# Patient Record
Sex: Male | Born: 1940 | Race: White | Hispanic: No | Marital: Married | Smoking: Former smoker
Health system: Southern US, Community
[De-identification: ages and names within clinical notes are randomized; demographics above are authoritative.]

## PROBLEM LIST (undated history)

## (undated) DIAGNOSIS — I739 Peripheral vascular disease, unspecified: Secondary | ICD-10-CM

## (undated) DIAGNOSIS — IMO0002 Reserved for concepts with insufficient information to code with codable children: Secondary | ICD-10-CM

## (undated) DIAGNOSIS — I779 Disorder of arteries and arterioles, unspecified: Secondary | ICD-10-CM

## (undated) DIAGNOSIS — F32A Depression, unspecified: Secondary | ICD-10-CM

## (undated) DIAGNOSIS — I10 Essential (primary) hypertension: Secondary | ICD-10-CM

## (undated) DIAGNOSIS — R9430 Abnormal result of cardiovascular function study, unspecified: Secondary | ICD-10-CM

## (undated) DIAGNOSIS — F329 Major depressive disorder, single episode, unspecified: Secondary | ICD-10-CM

## (undated) DIAGNOSIS — Z720 Tobacco use: Secondary | ICD-10-CM

## (undated) DIAGNOSIS — M1990 Unspecified osteoarthritis, unspecified site: Secondary | ICD-10-CM

## (undated) DIAGNOSIS — Z9081 Acquired absence of spleen: Secondary | ICD-10-CM

## (undated) DIAGNOSIS — C3490 Malignant neoplasm of unspecified part of unspecified bronchus or lung: Secondary | ICD-10-CM

## (undated) DIAGNOSIS — H3530 Unspecified macular degeneration: Secondary | ICD-10-CM

## (undated) DIAGNOSIS — Z951 Presence of aortocoronary bypass graft: Secondary | ICD-10-CM

## (undated) DIAGNOSIS — E785 Hyperlipidemia, unspecified: Secondary | ICD-10-CM

## (undated) DIAGNOSIS — J449 Chronic obstructive pulmonary disease, unspecified: Secondary | ICD-10-CM

## (undated) DIAGNOSIS — I4891 Unspecified atrial fibrillation: Secondary | ICD-10-CM

## (undated) DIAGNOSIS — I2510 Atherosclerotic heart disease of native coronary artery without angina pectoris: Secondary | ICD-10-CM

## (undated) DIAGNOSIS — C4490 Unspecified malignant neoplasm of skin, unspecified: Secondary | ICD-10-CM

## (undated) HISTORY — DX: Essential (primary) hypertension: I10

## (undated) HISTORY — PX: OTHER SURGICAL HISTORY: SHX169

## (undated) HISTORY — DX: Acquired absence of spleen: Z90.81

## (undated) HISTORY — DX: Major depressive disorder, single episode, unspecified: F32.9

## (undated) HISTORY — DX: Unspecified macular degeneration: H35.30

## (undated) HISTORY — DX: Disorder of arteries and arterioles, unspecified: I77.9

## (undated) HISTORY — DX: Depression, unspecified: F32.A

## (undated) HISTORY — DX: Malignant neoplasm of unspecified part of unspecified bronchus or lung: C34.90

## (undated) HISTORY — DX: Abnormal result of cardiovascular function study, unspecified: R94.30

## (undated) HISTORY — PX: THORACOTOMY: SUR1349

## (undated) HISTORY — DX: Tobacco use: Z72.0

## (undated) HISTORY — DX: Presence of aortocoronary bypass graft: Z95.1

## (undated) HISTORY — DX: Chronic obstructive pulmonary disease, unspecified: J44.9

## (undated) HISTORY — DX: Hyperlipidemia, unspecified: E78.5

## (undated) HISTORY — DX: Atherosclerotic heart disease of native coronary artery without angina pectoris: I25.10

## (undated) HISTORY — DX: Peripheral vascular disease, unspecified: I73.9

## (undated) HISTORY — DX: Unspecified malignant neoplasm of skin, unspecified: C44.90

## (undated) HISTORY — DX: Reserved for concepts with insufficient information to code with codable children: IMO0002

## (undated) HISTORY — DX: Unspecified osteoarthritis, unspecified site: M19.90

---

## 1962-04-14 HISTORY — PX: OTHER SURGICAL HISTORY: SHX169

## 1968-04-14 HISTORY — PX: APPENDECTOMY: SHX54

## 2002-12-22 ENCOUNTER — Encounter: Admission: RE | Admit: 2002-12-22 | Discharge: 2002-12-22 | Payer: Self-pay | Admitting: Endocrinology

## 2002-12-22 ENCOUNTER — Encounter: Payer: Self-pay | Admitting: Endocrinology

## 2005-04-22 ENCOUNTER — Ambulatory Visit: Payer: Self-pay | Admitting: Endocrinology

## 2005-04-22 ENCOUNTER — Ambulatory Visit (HOSPITAL_COMMUNITY): Admission: RE | Admit: 2005-04-22 | Discharge: 2005-04-22 | Payer: Self-pay | Admitting: Endocrinology

## 2005-04-28 ENCOUNTER — Ambulatory Visit: Payer: Self-pay | Admitting: Endocrinology

## 2005-04-28 ENCOUNTER — Ambulatory Visit (HOSPITAL_COMMUNITY): Admission: RE | Admit: 2005-04-28 | Discharge: 2005-04-28 | Payer: Self-pay | Admitting: Endocrinology

## 2005-05-02 ENCOUNTER — Ambulatory Visit: Payer: Self-pay | Admitting: Endocrinology

## 2005-05-13 ENCOUNTER — Ambulatory Visit: Payer: Self-pay | Admitting: Physical Medicine and Rehabilitation

## 2005-05-13 ENCOUNTER — Encounter
Admission: RE | Admit: 2005-05-13 | Discharge: 2005-08-11 | Payer: Self-pay | Admitting: Physical Medicine and Rehabilitation

## 2005-05-20 ENCOUNTER — Encounter
Admission: RE | Admit: 2005-05-20 | Discharge: 2005-08-18 | Payer: Self-pay | Admitting: Physical Medicine and Rehabilitation

## 2005-07-24 ENCOUNTER — Ambulatory Visit: Payer: Self-pay | Admitting: Physical Medicine and Rehabilitation

## 2005-08-21 ENCOUNTER — Encounter
Admission: RE | Admit: 2005-08-21 | Discharge: 2005-11-19 | Payer: Self-pay | Admitting: Physical Medicine and Rehabilitation

## 2005-08-27 ENCOUNTER — Ambulatory Visit: Payer: Self-pay | Admitting: Physical Medicine & Rehabilitation

## 2005-09-18 ENCOUNTER — Ambulatory Visit: Payer: Self-pay | Admitting: Physical Medicine and Rehabilitation

## 2006-01-06 ENCOUNTER — Ambulatory Visit: Payer: Self-pay | Admitting: Physical Medicine and Rehabilitation

## 2006-01-06 ENCOUNTER — Encounter
Admission: RE | Admit: 2006-01-06 | Discharge: 2006-04-06 | Payer: Self-pay | Admitting: Physical Medicine and Rehabilitation

## 2006-01-21 ENCOUNTER — Ambulatory Visit: Payer: Self-pay | Admitting: Physical Medicine & Rehabilitation

## 2006-01-21 ENCOUNTER — Encounter
Admission: RE | Admit: 2006-01-21 | Discharge: 2006-04-21 | Payer: Self-pay | Admitting: Physical Medicine & Rehabilitation

## 2006-04-28 ENCOUNTER — Encounter
Admission: RE | Admit: 2006-04-28 | Discharge: 2006-07-27 | Payer: Self-pay | Admitting: Physical Medicine and Rehabilitation

## 2006-04-28 ENCOUNTER — Ambulatory Visit: Payer: Self-pay | Admitting: Physical Medicine and Rehabilitation

## 2006-04-29 ENCOUNTER — Ambulatory Visit (HOSPITAL_COMMUNITY)
Admission: RE | Admit: 2006-04-29 | Discharge: 2006-04-29 | Payer: Self-pay | Admitting: Physical Medicine and Rehabilitation

## 2006-05-18 ENCOUNTER — Encounter
Admission: RE | Admit: 2006-05-18 | Discharge: 2006-06-24 | Payer: Self-pay | Admitting: Physical Medicine & Rehabilitation

## 2006-05-19 ENCOUNTER — Ambulatory Visit: Payer: Self-pay | Admitting: Physical Medicine & Rehabilitation

## 2006-06-23 ENCOUNTER — Ambulatory Visit: Payer: Self-pay | Admitting: Physical Medicine and Rehabilitation

## 2006-07-01 ENCOUNTER — Ambulatory Visit (HOSPITAL_COMMUNITY)
Admission: RE | Admit: 2006-07-01 | Discharge: 2006-07-01 | Payer: Self-pay | Admitting: Physical Medicine and Rehabilitation

## 2006-07-20 ENCOUNTER — Ambulatory Visit: Payer: Self-pay | Admitting: Physical Medicine and Rehabilitation

## 2006-07-20 ENCOUNTER — Encounter
Admission: RE | Admit: 2006-07-20 | Discharge: 2006-10-18 | Payer: Self-pay | Admitting: Physical Medicine and Rehabilitation

## 2006-08-08 ENCOUNTER — Ambulatory Visit: Payer: Self-pay | Admitting: Pulmonary Disease

## 2006-08-08 ENCOUNTER — Inpatient Hospital Stay (HOSPITAL_COMMUNITY): Admission: EM | Admit: 2006-08-08 | Discharge: 2006-08-17 | Payer: Self-pay | Admitting: Emergency Medicine

## 2006-08-08 ENCOUNTER — Ambulatory Visit: Payer: Self-pay | Admitting: Cardiology

## 2006-08-10 ENCOUNTER — Encounter: Payer: Self-pay | Admitting: Vascular Surgery

## 2006-08-10 HISTORY — PX: CORONARY ANGIOPLASTY: SHX604

## 2006-08-11 ENCOUNTER — Ambulatory Visit: Payer: Self-pay | Admitting: Surgery

## 2006-08-12 ENCOUNTER — Encounter: Payer: Self-pay | Admitting: Surgery

## 2006-09-10 ENCOUNTER — Ambulatory Visit: Payer: Self-pay | Admitting: *Deleted

## 2006-09-10 HISTORY — PX: CORONARY ARTERY BYPASS GRAFT: SHX141

## 2006-09-15 ENCOUNTER — Ambulatory Visit: Payer: Self-pay | Admitting: Surgery

## 2006-09-30 ENCOUNTER — Inpatient Hospital Stay (HOSPITAL_COMMUNITY): Admission: RE | Admit: 2006-09-30 | Discharge: 2006-10-05 | Payer: Self-pay | Admitting: Surgery

## 2006-09-30 ENCOUNTER — Encounter: Payer: Self-pay | Admitting: Surgery

## 2006-09-30 ENCOUNTER — Ambulatory Visit: Payer: Self-pay | Admitting: Surgery

## 2006-10-20 ENCOUNTER — Encounter: Admission: RE | Admit: 2006-10-20 | Discharge: 2006-10-20 | Payer: Self-pay | Admitting: Surgery

## 2006-10-20 ENCOUNTER — Ambulatory Visit: Payer: Self-pay | Admitting: Surgery

## 2006-10-23 ENCOUNTER — Inpatient Hospital Stay (HOSPITAL_COMMUNITY): Admission: AD | Admit: 2006-10-23 | Discharge: 2006-10-29 | Payer: Self-pay | Admitting: Surgery

## 2006-11-03 ENCOUNTER — Ambulatory Visit: Payer: Self-pay | Admitting: Gastroenterology

## 2006-11-11 ENCOUNTER — Ambulatory Visit: Payer: Self-pay | Admitting: Cardiology

## 2006-12-11 ENCOUNTER — Encounter: Payer: Self-pay | Admitting: Endocrinology

## 2006-12-11 DIAGNOSIS — Z85828 Personal history of other malignant neoplasm of skin: Secondary | ICD-10-CM

## 2006-12-24 ENCOUNTER — Ambulatory Visit: Payer: Self-pay | Admitting: Cardiology

## 2006-12-24 LAB — CONVERTED CEMR LAB
ALT: 18 units/L (ref 0–53)
AST: 20 units/L (ref 0–37)
Basophils Absolute: 0 10*3/uL (ref 0.0–0.1)
Basophils Relative: 0.2 % (ref 0.0–1.0)
Bilirubin, Direct: 0.1 mg/dL (ref 0.0–0.3)
Eosinophils Relative: 1.6 % (ref 0.0–5.0)
Hemoglobin: 11.6 g/dL — ABNORMAL LOW (ref 13.0–17.0)
Lymphocytes Relative: 32.6 % (ref 12.0–46.0)
MCV: 87.5 fL (ref 78.0–100.0)
Monocytes Relative: 10.8 % (ref 3.0–11.0)
Neutrophils Relative %: 54.8 % (ref 43.0–77.0)
Platelets: 251 10*3/uL (ref 150–400)
RDW: 18.4 % — ABNORMAL HIGH (ref 11.5–14.6)

## 2007-01-01 ENCOUNTER — Ambulatory Visit: Payer: Self-pay | Admitting: Cardiology

## 2007-01-01 LAB — CONVERTED CEMR LAB
HDL: 78 mg/dL (ref >39.0)
LDL Cholesterol: 53 mg/dL (ref 0–99)
VLDL: 14 mg/dL (ref 0–40)

## 2007-01-19 ENCOUNTER — Ambulatory Visit: Payer: Self-pay | Admitting: Surgery

## 2007-01-19 ENCOUNTER — Encounter: Admission: RE | Admit: 2007-01-19 | Discharge: 2007-01-19 | Payer: Self-pay | Admitting: Surgery

## 2007-01-20 ENCOUNTER — Ambulatory Visit: Payer: Self-pay | Admitting: Endocrinology

## 2007-04-20 ENCOUNTER — Ambulatory Visit: Payer: Self-pay | Admitting: Surgery

## 2007-04-20 ENCOUNTER — Encounter: Admission: RE | Admit: 2007-04-20 | Discharge: 2007-04-20 | Payer: Self-pay | Admitting: Surgery

## 2007-04-20 ENCOUNTER — Encounter: Payer: Self-pay | Admitting: Endocrinology

## 2007-06-21 ENCOUNTER — Ambulatory Visit: Payer: Self-pay | Admitting: Cardiology

## 2007-06-21 LAB — CONVERTED CEMR LAB
AST: 19 units/L (ref 0–37)
Albumin: 3.7 g/dL (ref 3.5–5.2)
Alkaline Phosphatase: 66 units/L (ref 39–117)
Total Bilirubin: 0.7 mg/dL (ref 0.3–1.2)
Triglycerides: 72 mg/dL (ref 0–149)

## 2007-06-29 ENCOUNTER — Ambulatory Visit: Payer: Self-pay | Admitting: Cardiology

## 2007-10-19 ENCOUNTER — Encounter: Admission: RE | Admit: 2007-10-19 | Discharge: 2007-10-19 | Payer: Self-pay | Admitting: Surgery

## 2007-10-19 ENCOUNTER — Ambulatory Visit: Payer: Self-pay | Admitting: Surgery

## 2008-01-20 ENCOUNTER — Ambulatory Visit: Payer: Self-pay | Admitting: Cardiology

## 2008-01-20 LAB — CONVERTED CEMR LAB
Alkaline Phosphatase: 66 units/L (ref 39–117)
Bilirubin, Direct: 0.1 mg/dL (ref 0.0–0.3)
Cholesterol: 157 mg/dL (ref 0–200)
LDL Cholesterol: 76 mg/dL (ref 0–99)
Total CHOL/HDL Ratio: 2.8

## 2008-04-25 ENCOUNTER — Encounter: Admission: RE | Admit: 2008-04-25 | Discharge: 2008-04-25 | Payer: Self-pay | Admitting: Surgery

## 2008-04-25 ENCOUNTER — Ambulatory Visit: Payer: Self-pay | Admitting: Surgery

## 2008-07-20 ENCOUNTER — Ambulatory Visit: Payer: Self-pay | Admitting: Cardiology

## 2008-07-20 LAB — CONVERTED CEMR LAB
HDL: 58.1 mg/dL (ref >39.00)
LDL Cholesterol: 84 mg/dL (ref 0–99)
Total Bilirubin: 0.8 mg/dL (ref 0.3–1.2)
Total CHOL/HDL Ratio: 3
VLDL: 20.4 mg/dL (ref 0.0–40.0)

## 2008-07-21 DIAGNOSIS — Z8669 Personal history of other diseases of the nervous system and sense organs: Secondary | ICD-10-CM | POA: Insufficient documentation

## 2008-07-21 DIAGNOSIS — Z8739 Personal history of other diseases of the musculoskeletal system and connective tissue: Secondary | ICD-10-CM

## 2008-07-25 ENCOUNTER — Encounter: Payer: Self-pay | Admitting: Cardiology

## 2008-07-25 ENCOUNTER — Ambulatory Visit: Payer: Self-pay | Admitting: Cardiology

## 2008-09-14 IMAGING — CR DG TIBIA/FIBULA 2V*L*
3 series · 3 of 3 positions shown · non-contrast
Comparison: none

CLINICAL DATA: Pain and numbness extending to distal left tibia and fibula.
 LEFT TIBIA AND FIBULA - 3 VIEW:

[view not recorded (1 of 3)]
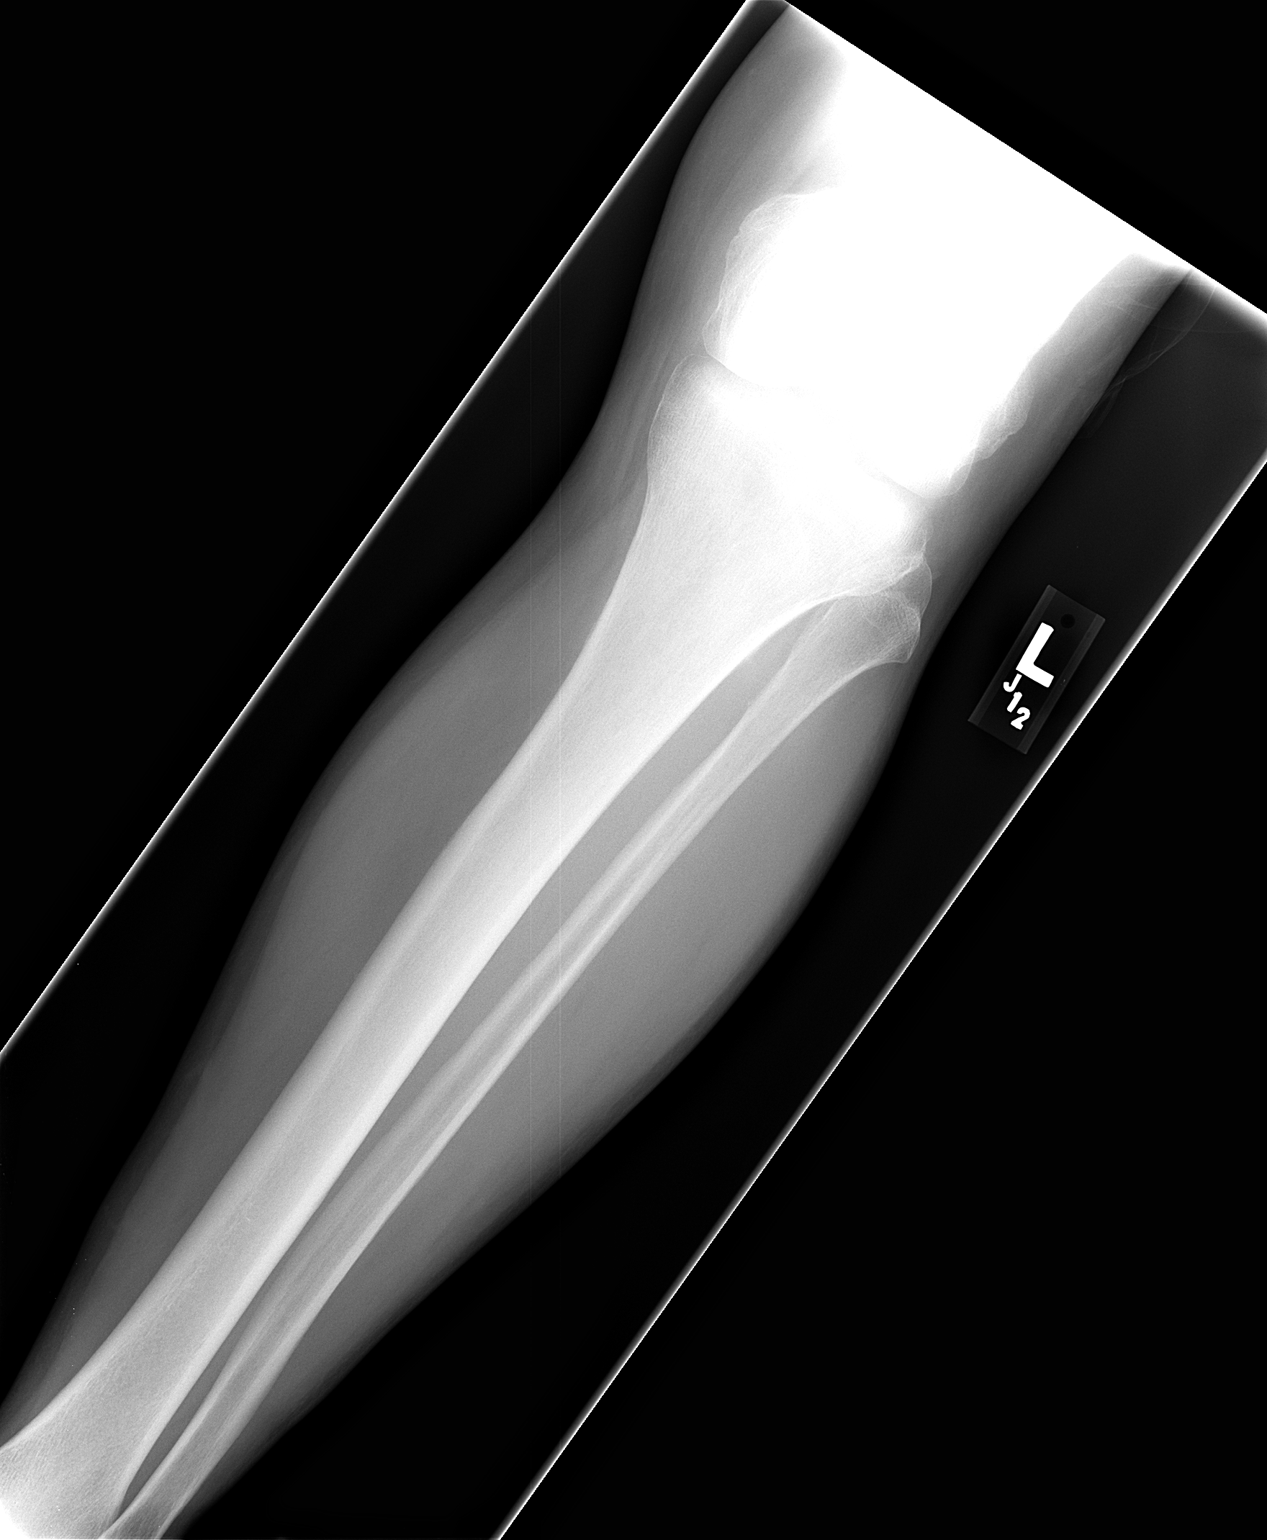

[view not recorded (2 of 3)]
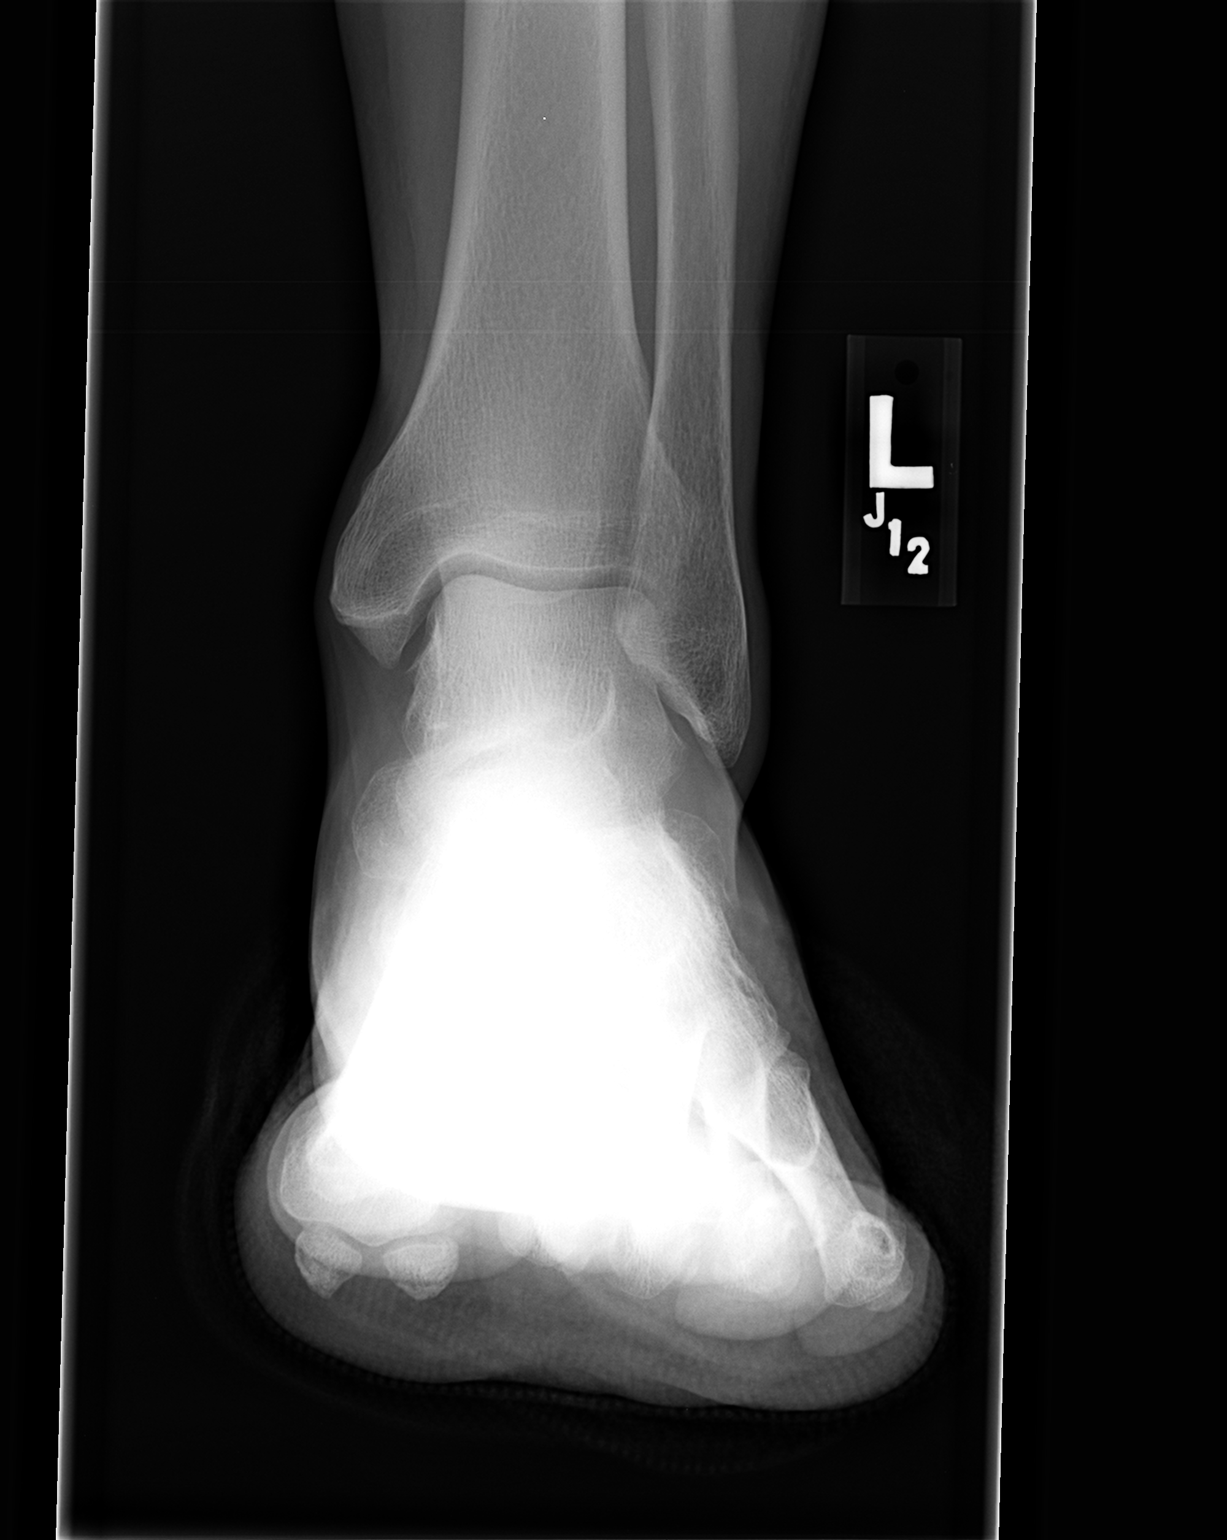

[view not recorded (3 of 3)]
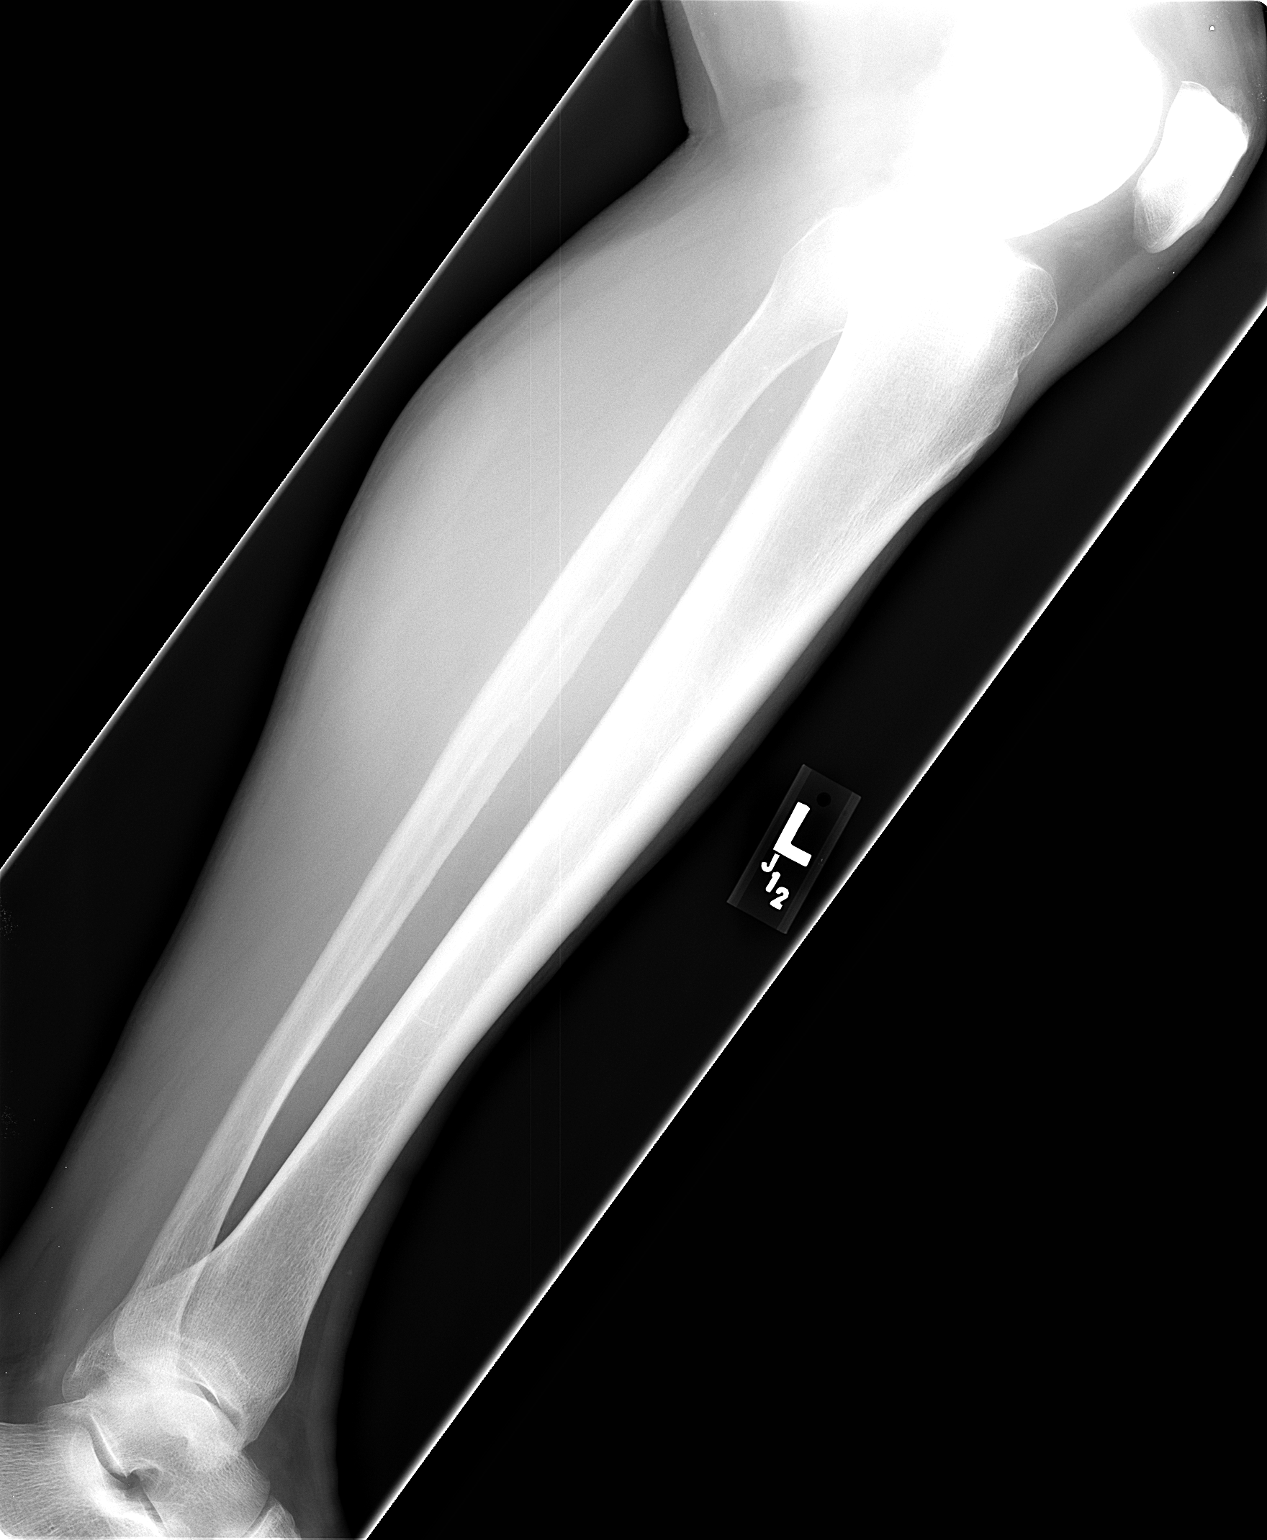

[3 of 3 positions shown; findings below may reference images not displayed]

FINDINGS: There is no evidence of fracture or other focal bone lesions.  Soft tissues are unremarkable.
IMPRESSION: Negative.

## 2008-11-07 ENCOUNTER — Encounter: Admission: RE | Admit: 2008-11-07 | Discharge: 2008-11-07 | Payer: Self-pay | Admitting: Surgery

## 2008-11-07 ENCOUNTER — Ambulatory Visit: Payer: Self-pay | Admitting: Surgery

## 2009-01-17 ENCOUNTER — Encounter: Payer: Self-pay | Admitting: Cardiology

## 2009-02-14 IMAGING — CR DG CHEST 2V
2 series · 2 of 2 positions shown · non-contrast
Comparison: 2-view chest 08/16/06 and chest CT 08/08/06.

CLINICAL DATA: Lung lesion.  Preoperative respiratory exam.  CABG.  
 CHEST ? 2 VIEW:

[view not recorded (1 of 2)]
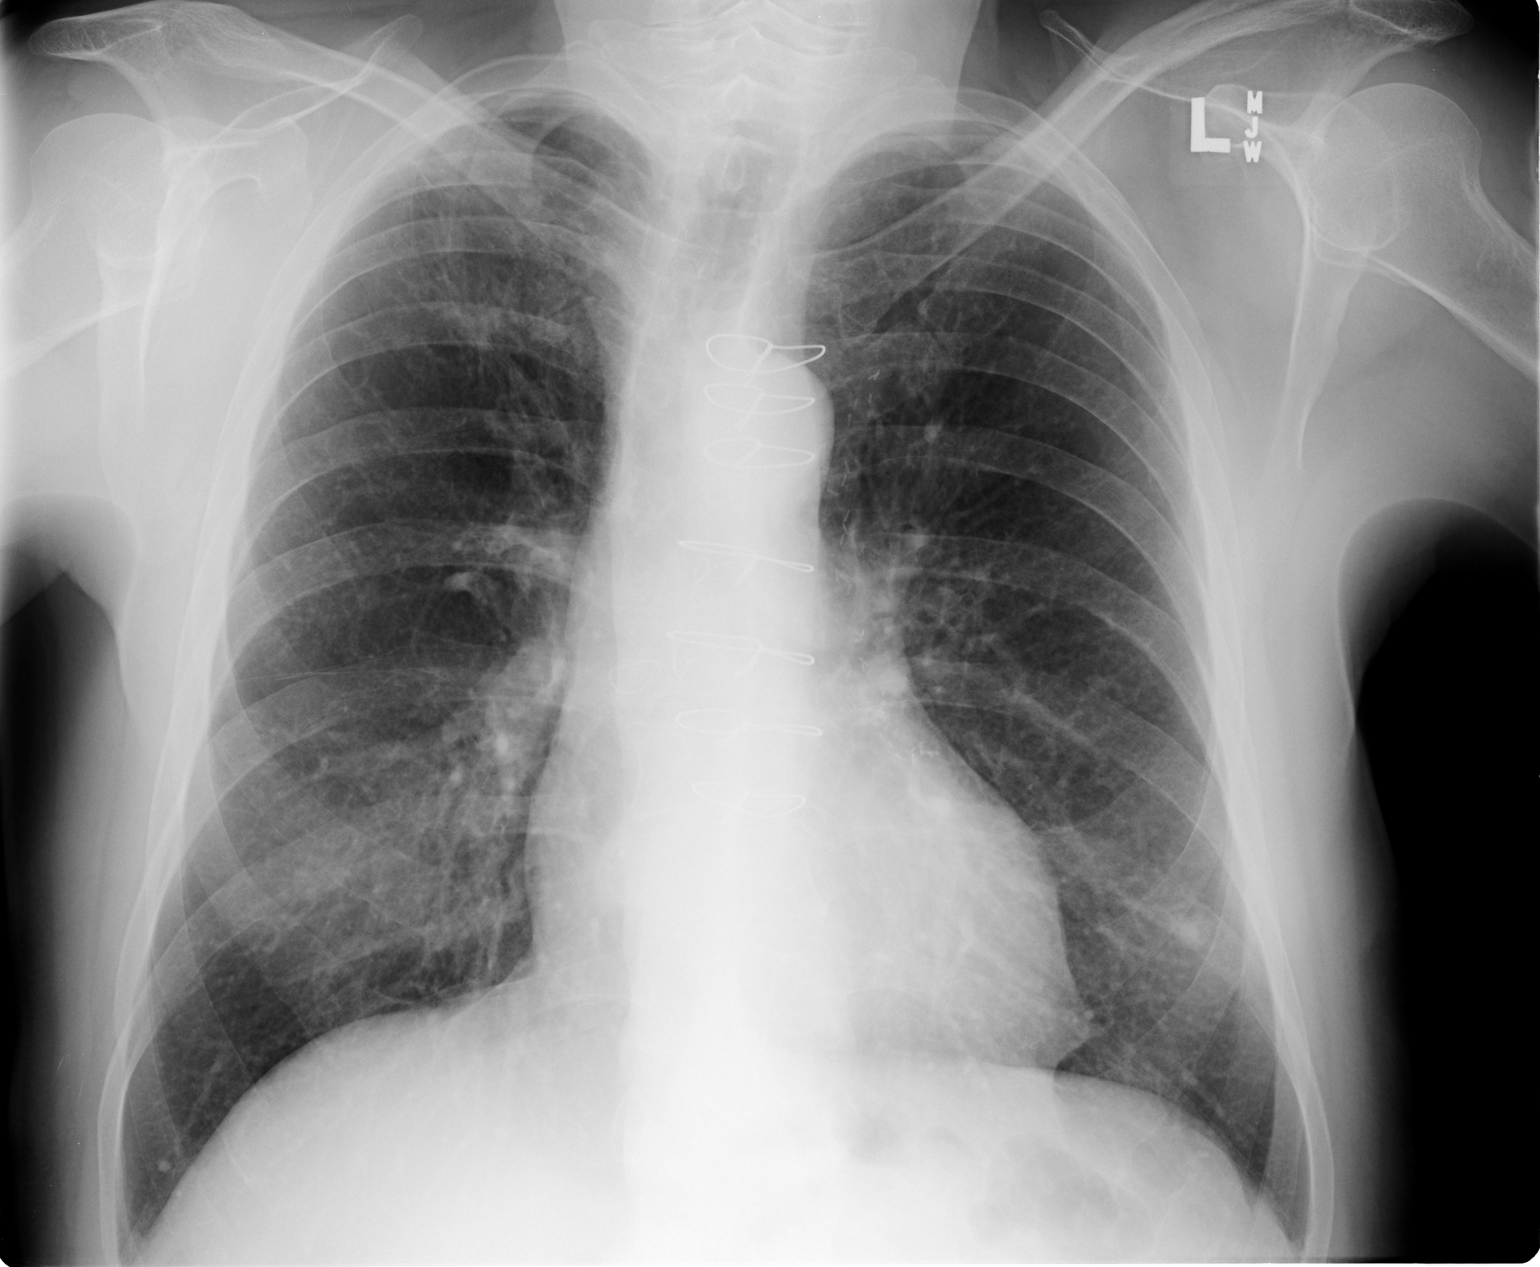

[view not recorded (2 of 2)]
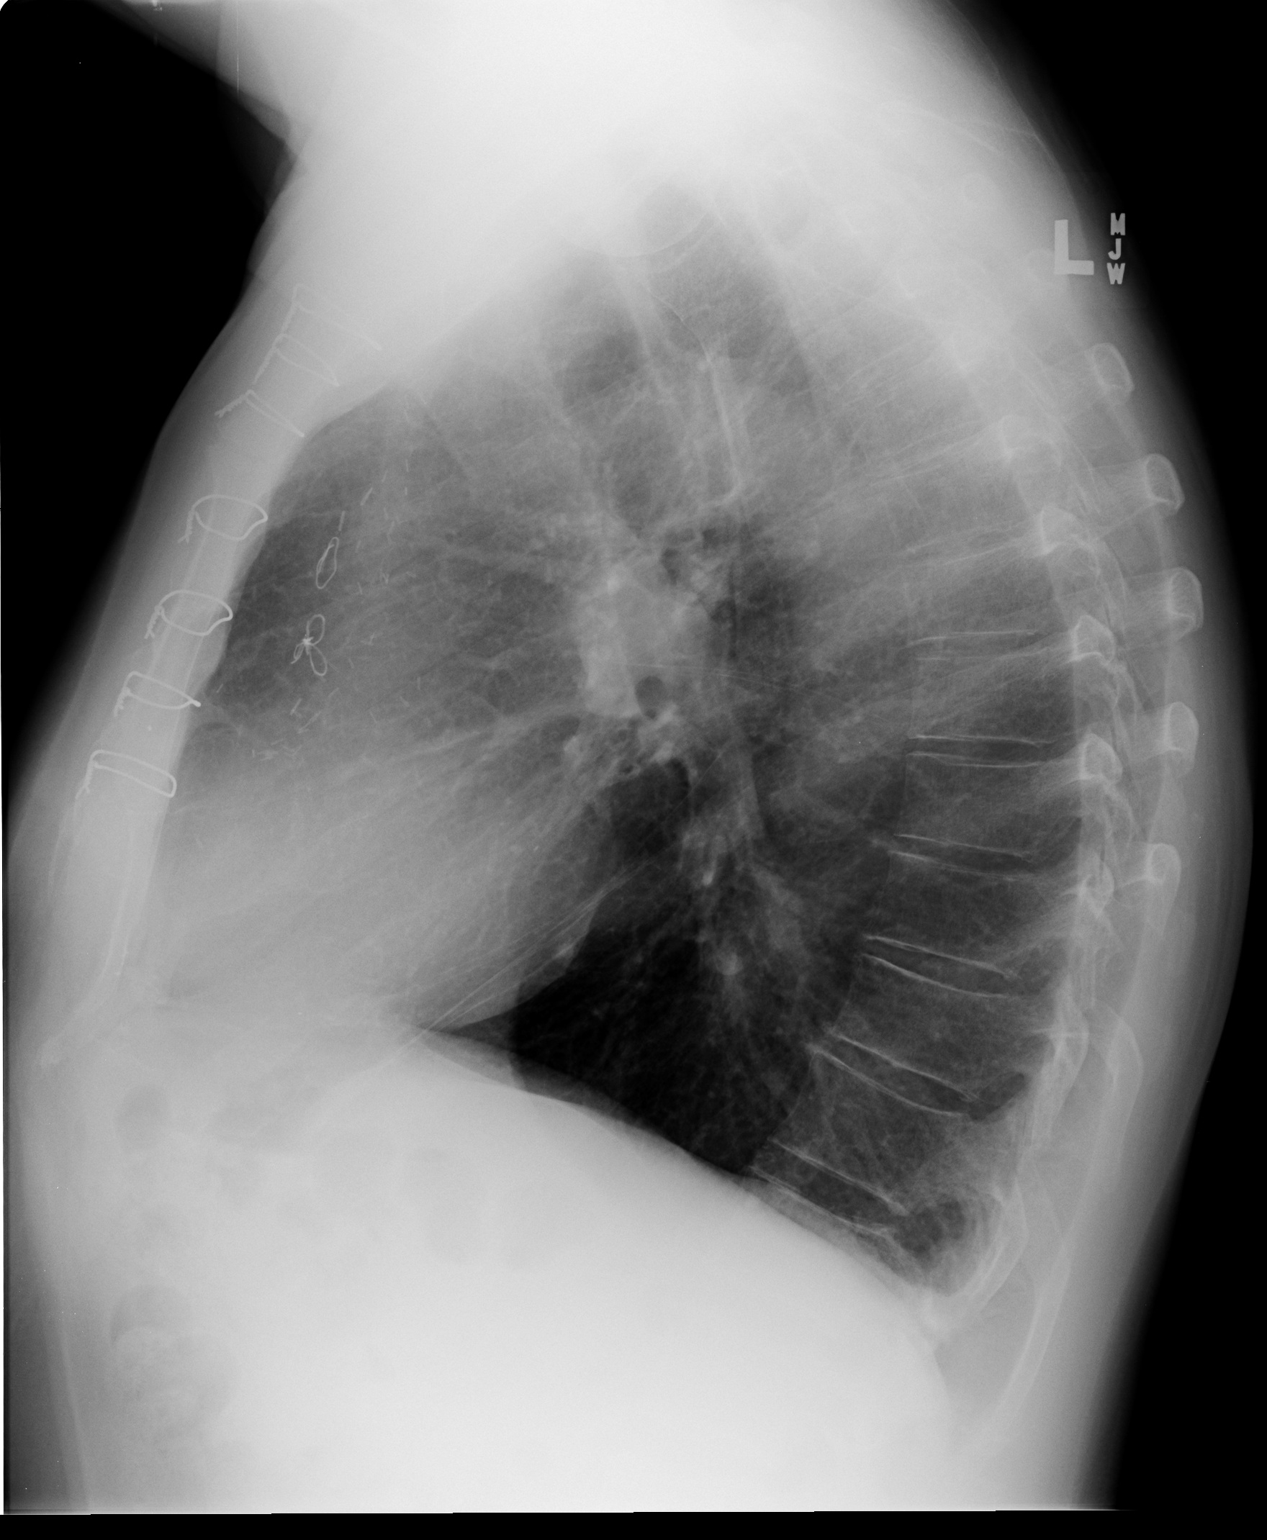

[2 of 2 positions shown; findings below may reference images not displayed]

FINDINGS: The cardiomediastinal contours are stable status post median sternotomy and CABG.  The previously demonstrated pleural effusions have resolved and the basilar aeration is improved.  There is no edema or pneumothorax.  Irregular density at the right apex appears slightly less dense than on the prior examinations.  This difference could be positional, as this density no longer overlies the anterior aspect of the right first rib.  No acute osseous findings are seen.
IMPRESSION: 1.  Interval resolution of bilateral pleural effusion with improved basilar aeration.  
 2.  The known spiculated density at the right apex may be slightly less dense.  This difference could be technical.  Correlate clinically.  Follow-up CT may be helpful if further evaluation is warranted.  
 3.  No acute chest findings.

## 2009-02-17 IMAGING — CR DG CHEST 1V PORT
1 series · 1 of 1 positions shown · non-contrast
Comparison: 10/01/06 and CT chest 08/08/06.

CLINICAL DATA: Lung lesion.
 PORTABLE CHEST - 1 VIEW:

[view not recorded]
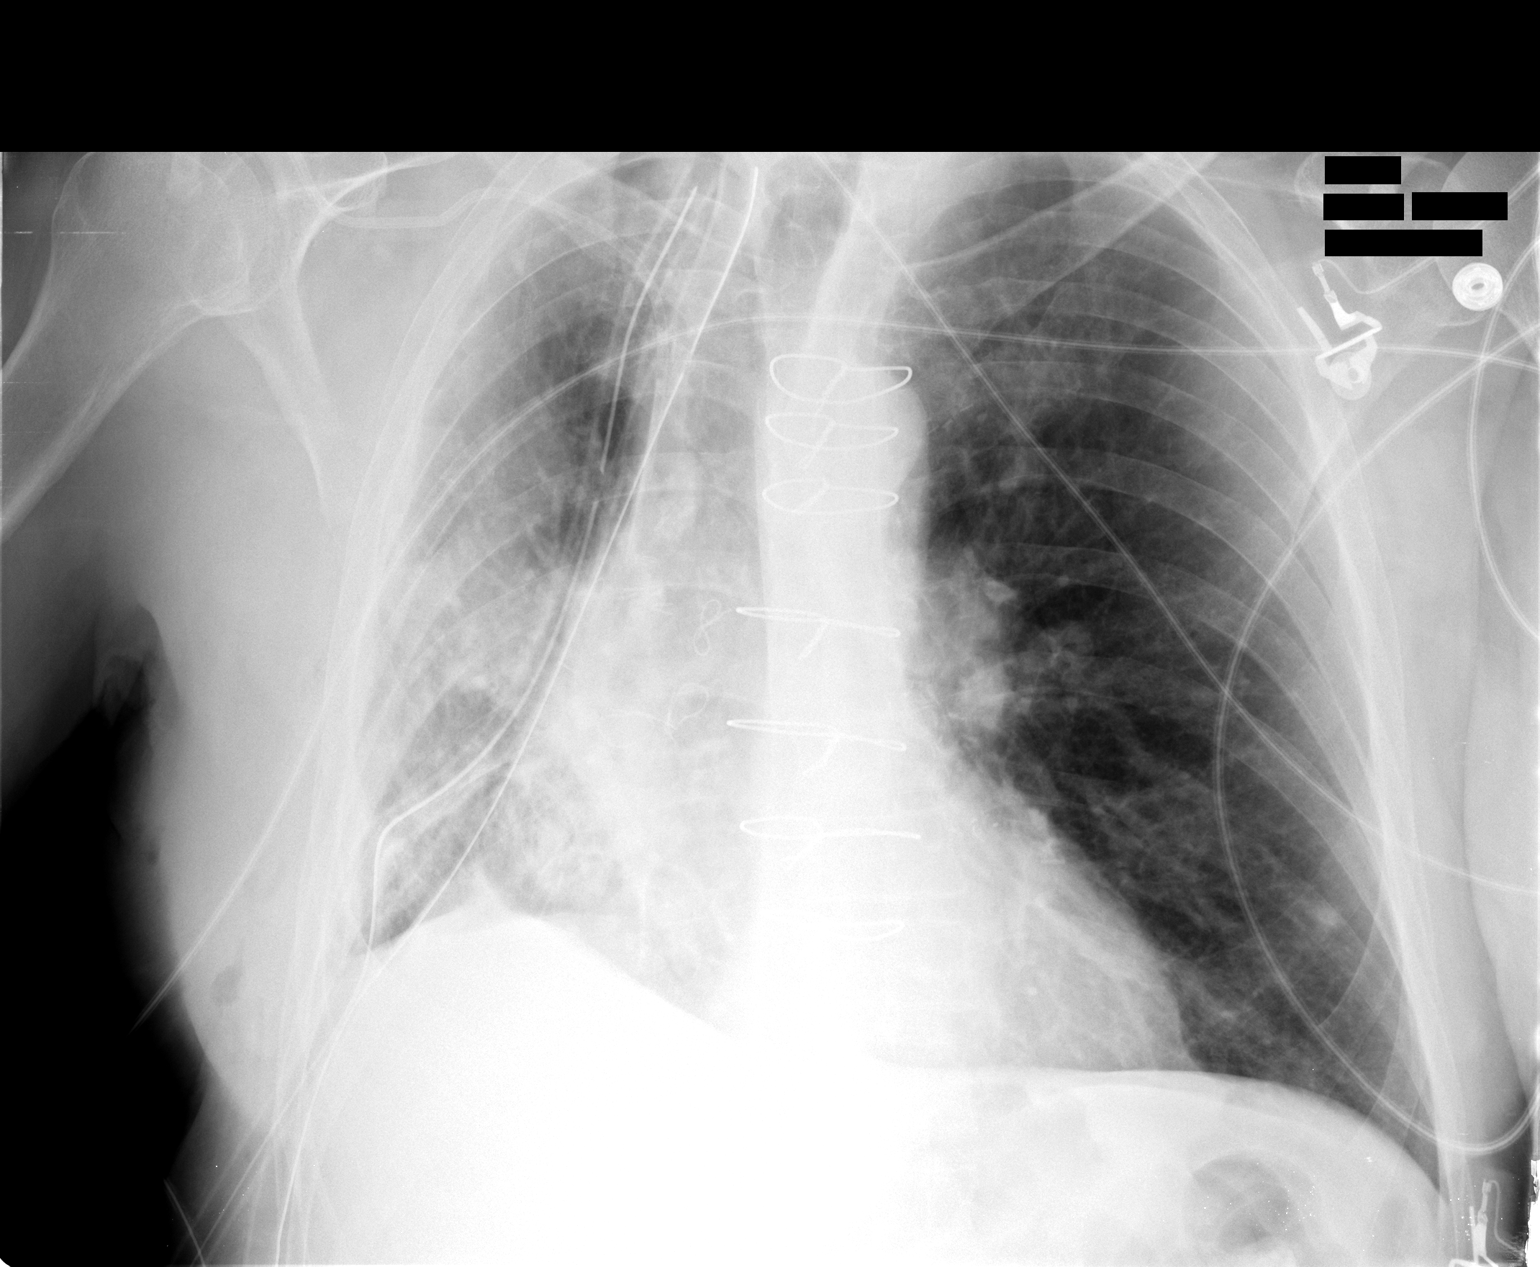

[1 of 1 positions shown; findings below may reference images not displayed]

FINDINGS: Postoperative changes and volume loss are seen in the right hemithorax where there is persistent right perihilar opacity. Two right chest tubes with a small right apical pneumothorax. Small left lower lobe nodule is again seen.  Improving left basilar aeration.
IMPRESSION: 1.  Postoperative changes in the right hemithorax with tiny right apical pneumothorax and persistent right perihilar opacity.
 2.  Improving left basilar aeration.

## 2009-02-18 IMAGING — CR DG CHEST 1V PORT
1 series · 1 of 1 positions shown · non-contrast
Comparison: Portal chest x-ray yesterday and dating back to 09/30/2006.

CLINICAL DATA: Postop right upper lobectomy for lung cancer.

PORTABLE CHEST - 1 VIEW  [DATE]/[id number] hours:

[view not recorded]
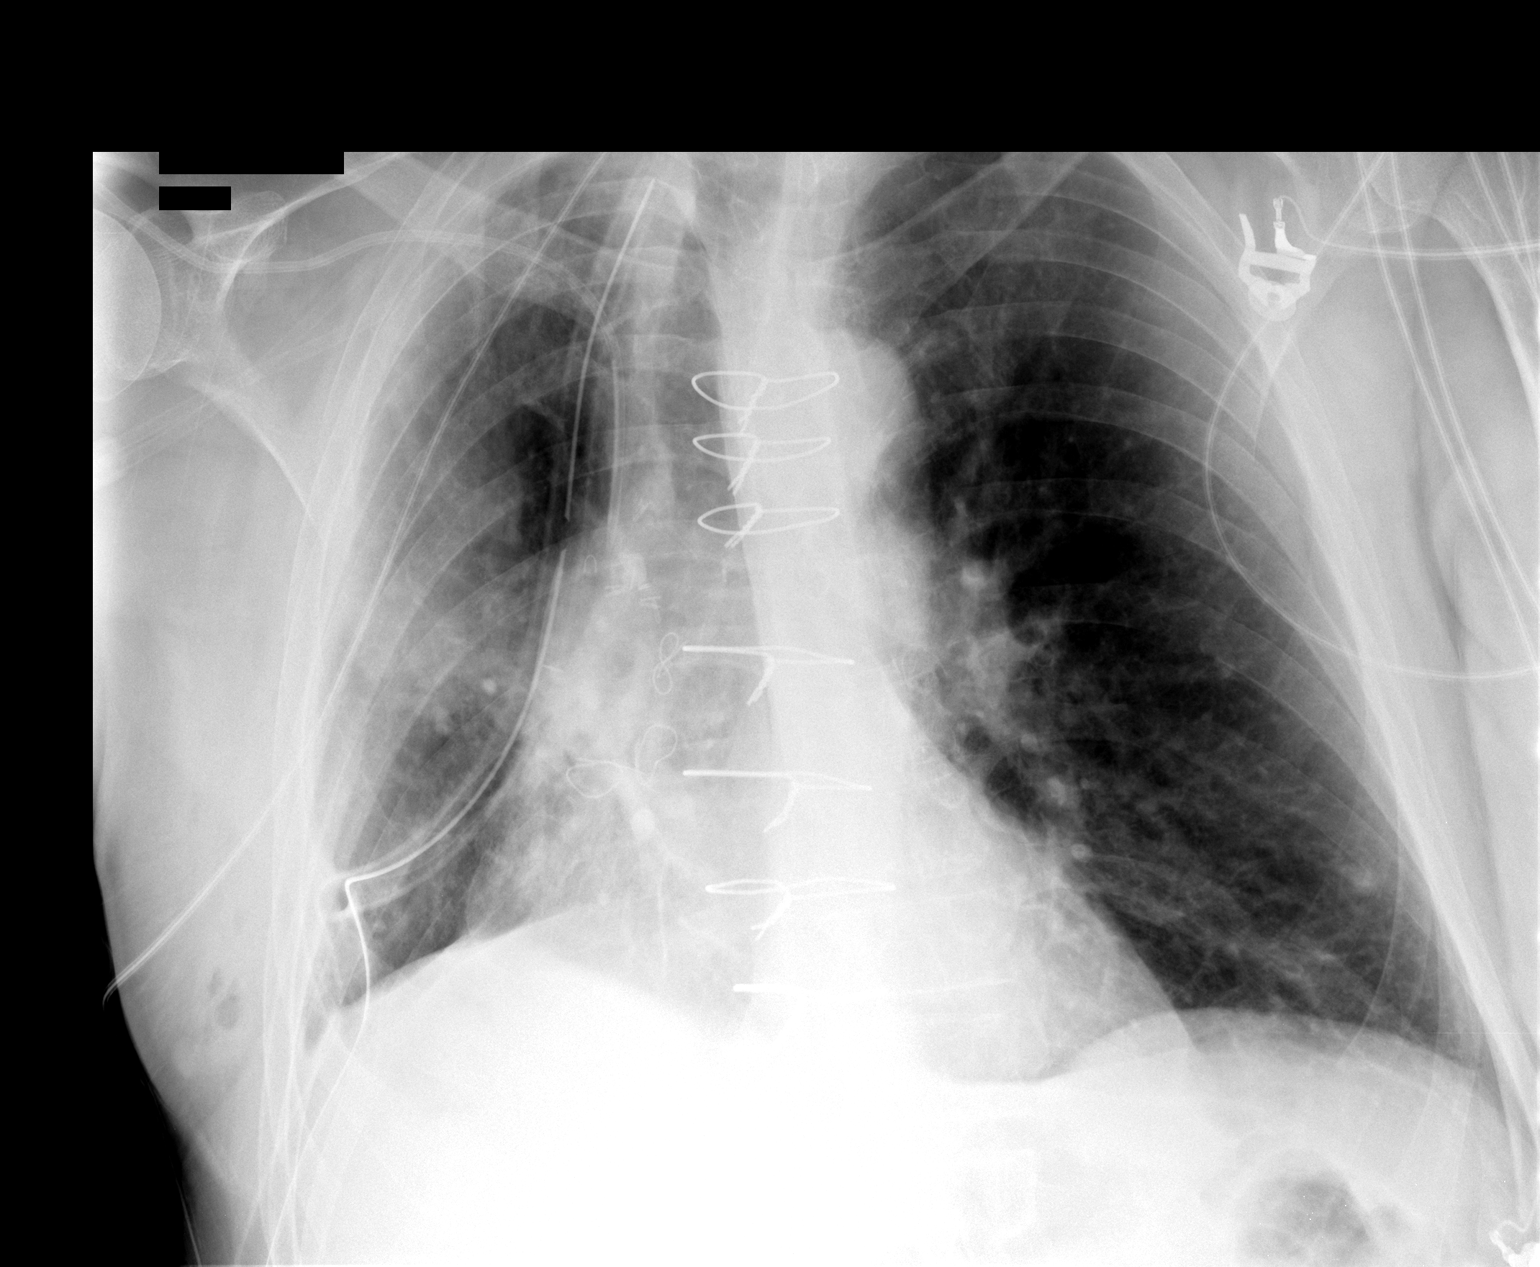

[1 of 1 positions shown; findings below may reference images not displayed]

FINDINGS: Removal of 1 of the 2 right chest tubes. Resolution of the small
right apical pneumothorax. Improved aeration throughout the remaining right
lung, with mild residual atelectasis or contusion in the right mid lung.
Calcified granuloma left lower lobe unchanged. Left lung otherwise clear. Right
subclavian central venous catheter tip remains in the SVC.
IMPRESSION: No pneumothorax after removal of 1 of the 2 right chest tubes. Improved aeration
in the remaining right lung with minimal residual post-operative
atelectasis/contusion persisting. Left lower lobe granuloma. Left lung remains
clear otherwise.

## 2009-04-17 ENCOUNTER — Encounter: Admission: RE | Admit: 2009-04-17 | Discharge: 2009-04-17 | Payer: Self-pay | Admitting: Surgery

## 2009-04-17 ENCOUNTER — Ambulatory Visit: Payer: Self-pay | Admitting: Surgery

## 2009-05-10 ENCOUNTER — Telehealth (INDEPENDENT_AMBULATORY_CARE_PROVIDER_SITE_OTHER): Payer: Self-pay | Admitting: *Deleted

## 2009-07-26 ENCOUNTER — Ambulatory Visit: Payer: Self-pay | Admitting: Cardiology

## 2009-07-30 ENCOUNTER — Encounter: Payer: Self-pay | Admitting: Cardiology

## 2009-07-30 LAB — CONVERTED CEMR LAB
ALT: 15 units/L (ref 0–53)
AST: 19 units/L (ref 0–37)
Bilirubin, Direct: 0 mg/dL (ref 0.0–0.3)
LDL Cholesterol: 70 mg/dL (ref 0–99)
Total Bilirubin: 0.2 mg/dL — ABNORMAL LOW (ref 0.3–1.2)
Total CHOL/HDL Ratio: 3
Triglycerides: 99 mg/dL (ref 0.0–149.0)

## 2009-07-31 ENCOUNTER — Encounter: Payer: Self-pay | Admitting: Cardiology

## 2009-08-02 ENCOUNTER — Ambulatory Visit: Payer: Self-pay | Admitting: Cardiology

## 2009-10-23 ENCOUNTER — Encounter: Payer: Self-pay | Admitting: Internal Medicine

## 2009-10-23 ENCOUNTER — Encounter: Admission: RE | Admit: 2009-10-23 | Discharge: 2009-10-23 | Payer: Self-pay | Admitting: Surgery

## 2009-10-23 ENCOUNTER — Ambulatory Visit: Payer: Self-pay | Admitting: Surgery

## 2009-10-30 ENCOUNTER — Ambulatory Visit: Payer: Self-pay | Admitting: Internal Medicine

## 2009-11-13 LAB — CONVERTED CEMR LAB
ALT: 16 units/L (ref 0–53)
Albumin: 4.1 g/dL (ref 3.5–5.2)
Alkaline Phosphatase: 87 units/L (ref 39–117)
Chloride: 107 meq/L (ref 96–112)
Cholesterol: 191 mg/dL (ref 0–200)
Creatinine, Ser: 0.9 mg/dL (ref 0.4–1.5)
GFR calc non Af Amer: 91.44 mL/min (ref >60)
LDL Cholesterol: 109 mg/dL — ABNORMAL HIGH (ref 0–99)
Potassium: 4.6 meq/L (ref 3.5–5.1)
Total Protein: 7.1 g/dL (ref 6.0–8.3)
Triglycerides: 118 mg/dL (ref 0.0–149.0)

## 2010-03-19 ENCOUNTER — Telehealth: Payer: Self-pay | Admitting: Cardiology
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
## 2010-05-16 NOTE — Letter (Signed)
Summary: Custom - Lipid  [facility], Main Office  [address]   [place], [place] [address]   Phone: [phone]  Fax: [phone]     July 30, 2009 MRN: [id number]   [name] [address] [place], [place]  [address]   Dear [name],  We have reviewed your cholesterol results.  They are as follows:     Total Cholesterol:    142 (Desirable: less than 200)       HDL  Cholesterol:     52.70  (Desirable: greater than 40 for men and 50 for women)       LDL Cholesterol:       70  (Desirable: less than 100 for low risk and less than 70 for moderate to high risk)       Triglycerides:       99.0  (Desirable: less than 150)  Our recommendations include:  Looks Good, continue current medications   Call our office at the number listed above if you have any questions.  Lowering your LDL cholesterol is important, but it is only one of a large number of "risk factors" that may indicate that you are at risk for heart disease, stroke or other complications of hardening of the arteries.  Other risk factors include:   A.  Cigarette Smoking* B.  High Blood Pressure* C.  Obesity* D.   Low HDL Cholesterol (see yours above)* E.   Diabetes Mellitus (higher risk if your is uncontrolled) F.  Family history of premature heart disease G.  Previous history of stroke or cardiovascular disease    *These are risk factors YOU HAVE CONTROL OVER.  For more information, visit .  There is now evidence that lowering the TOTAL CHOLESTEROL AND LDL CHOLESTEROL can reduce the risk of heart disease.  The American Heart Association recommends the following guidelines for the treatment of elevated cholesterol:  1.  If there is now current heart disease and less than two risk factors, TOTAL CHOLESTEROL should be less than 200 and LDL CHOLESTEROL should be less than 100. 2.  If there is current heart disease or two or more risk factors, TOTAL CHOLESTEROL should be less than 200 and  LDL CHOLESTEROL should be less than 70.  A diet low in cholesterol, saturated fat, and calories is the cornerstone of treatment for elevated cholesterol.  Cessation of smoking and exercise are also important in the management of elevated cholesterol and preventing vascular disease.  Studies have shown that 30 to 60 minutes of physical activity most days can help lower blood pressure, lower cholesterol, and keep your weight at a healthy level.  Drug therapy is used when cholesterol levels do not respond to therapeutic lifestyle changes (smoking cessation, diet, and exercise) and remains unacceptably high.  If medication is started, it is important to have you levels checked periodically to evaluate the need for further treatment options.  Thank you,    [employer] Team
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
## 2010-05-16 NOTE — Letter (Signed)
Summary: Generic Letter  [facility], Main Office  [address]   [place], [place] [address]   Phone: [phone]  Fax: [phone]        October 23, 2009 MRN: [id number]    [name] [address] [place], [place]  [address]    Dear Mr. Hogston,  Our records indicate it is time to check your cholesterol, due to your medication change in April.  Please call our office to schedule an appt for labwork.  Please remember it is a fasting lab.     Sincerely,  Meredith Staggers, RN Arvilla Meres, MD This letter has been electronically signed by your physician.

## 2010-05-16 NOTE — Progress Notes (Signed)
Summary: pt medication is increasing due to insurance  Phone Note Call from Patient Call back at Home Phone [phone]   Caller: Patient Reason for Call: Talk to Nurse, Talk to Doctor Summary of Call: pt insurance changed and medication has had an icrease and he wants to discuss making some changes to a cheaper medication that they will pay for Initial call taken by: Omer Jack,  March 19, 2010 8:20 AM  Follow-up for Phone Call        pt states his ins. company is increasing price of Metoprolol ER adn he needs to change to either metoprolol tartrate, atenolol or carvedilol, he is getting his metoprolol ER refilled now but will need new med next time, will discuss w/Dr Myrtis Ser and call pt back.  Also pt states he had discussed changing him from simvastatin to generic lipitor when available, advised now available new rx sent in [facility], RN  March 19, 2010 9:02 AM   Pt. states that the generic lipitor is a $45 copay and he can't afford this. I'm not sure why it is this much..? He would like to switch back to Simvastatin which I told him should be okay but I would need to forward this to Dr. Myrtis Ser. He also would like to switch to another beta-blocker. His Metoprolol Succinate will be $45 @ the beginning of the year. His pharmacy told him that Carvedilol, Atenolol, and Metoprolol Tartrate are only $3 copays. Will forward to Dr. Myrtis Ser and his RN. Whitney Maeola Sarah RN  March 21, 2010 3:56 PM  Follow-up by: Whitney Maeola Sarah RN,  March 21, 2010 3:56 PM  Additional Follow-up for Phone Call Additional follow up Details #1::        Use metoprolol tartrate 25 two times a day  Pt calling regarding generic brand of Lipitor Judie Grieve  March 21, 2010 3:26 PM    New/Updated Medications: LIPITOR 40 MG TABS (ATORVASTATIN CALCIUM) Take 1 tablet by mouth once a day Prescriptions: LIPITOR 40 MG TABS (ATORVASTATIN CALCIUM) Take 1 tablet by mouth once a day  #30 x 12   Entered by:    Meredith Staggers, RN   Authorized by:   Talitha Givens, MD, [facility]   Signed by:   Meredith Staggers, RN on 03/19/2010   Method used:   Electronically to        [facility]  [address] [phone]* (retail)       [address]       [place], [place]  [address]       Ph: [phone] or [phone]       Fax: [phone]   RxID:   [id number]   Appended Document: pt medication is increasing due to insurance pt is aware to change to metoprolol tartrate, new rx sent in, he also states he can not afford Lipitor generic and will cont. taking his simva 40mg  daily   Clinical Lists Changes  Medications: Changed medication from METOPROLOL SUCCINATE 25 MG  TB24 (METOPROLOL SUCCINATE) take 1 by mouth qd to METOPROLOL TARTRATE 25 MG TABS (METOPROLOL TARTRATE) Take one tablet by mouth twice a day - Signed Changed medication from LIPITOR 40 MG TABS (ATORVASTATIN CALCIUM) Take 1 tablet by mouth once a day to SIMVASTATIN 40 MG TABS (SIMVASTATIN) Take one tablet by mouth daily at bedtime Rx of METOPROLOL TARTRATE 25 MG TABS (METOPROLOL TARTRATE) Take one tablet by mouth twice a day;  #  60 x 12;  Signed;  Entered by: Meredith Staggers, RN;  Authorized by: Talitha Givens, MD, [facility];  Method used: Electronically [facility]  [address] [phone]*, [address] [place], [place], [place]  [address], Ph: [phone] or [phone], Fax: [phone]    Prescriptions: METOPROLOL TARTRATE 25 MG TABS (METOPROLOL TARTRATE) Take one tablet by mouth twice a day  #60 x 12   Entered by:   Meredith Staggers, RN   Authorized by:   Talitha Givens, MD, [facility]   Signed by:   Meredith Staggers, RN on 03/26/2010   Method used:   [facility]        [facility]  [address] [phone][facility] (retail)       [address]       [place], [place]  [address]       Ph: [phone] or [phone]       Fax: [phone]   RxID:   [id number]

## 2010-05-16 NOTE — Progress Notes (Signed)
  Walk in Patient Form Recieved " Pt. dropped off Physicians Statement for Heart Disease paper, forwarded to [facility] S...  [name]  May 10, 2009 3:13 PM

## 2010-05-20 ENCOUNTER — Other Ambulatory Visit: Payer: Self-pay | Admitting: Surgery

## 2010-05-20 DIAGNOSIS — C3410 Malignant neoplasm of upper lobe, unspecified bronchus or lung: Secondary | ICD-10-CM

## 2010-05-21 ENCOUNTER — Ambulatory Visit
Admission: RE | Admit: 2010-05-21 | Discharge: 2010-05-21 | Disposition: A | Discharge status: Home / Self Care | Payer: MEDICARE | Source: Ambulatory Visit | Attending: Surgery | Admitting: Surgery

## 2010-05-21 ENCOUNTER — Ambulatory Visit: Payer: Self-pay | Admitting: Surgery

## 2010-05-21 ENCOUNTER — Encounter: Payer: Self-pay | Admitting: Endocrinology

## 2010-05-21 ENCOUNTER — Encounter (INDEPENDENT_AMBULATORY_CARE_PROVIDER_SITE_OTHER): Payer: MEDICARE | Admitting: Surgery

## 2010-05-21 DIAGNOSIS — C3410 Malignant neoplasm of upper lobe, unspecified bronchus or lung: Secondary | ICD-10-CM

## 2010-05-24 NOTE — Assessment & Plan Note (Signed)
OFFICE VISIT  [name], [name] DOB:  11-27-1940                                        May 21, 2010 CHART #:  [id number]  The patient returned to my office today for followup, status post right thoracotomy and right upper lobectomy for a T1, N0, MX moderately differentiated squamous cell carcinoma on September 30, 2006.  I last saw him October 23, 2009, at which time he was doing well.  Since that time, he said he has continued to feel well without any chest pain or shortness of breath.  He denies any cough or hemoptysis.  He has had no headache or visual changes.  He denies any muscle pain.  His appetite has been good and he has actually been gaining weight which he attributes to overeating.  He continues to play golf 3 days per week.  On physical examination today, his blood pressure is 129/76, pulse is 56 and regular, respiratory rate is 16 and unlabored.  Oxygen saturation on room air is 95%.  He looks well.  Cardiac exam shows regular rate and rhythm with normal heart sounds.  His lung exam is clear.  The right thoracotomy incision looks fine.  There are no skin lesions in the area of the incision.  There is no cervical or supraclavicular adenopathy. There is no axillary adenopathy.  Abdominal exam shows active bowel sounds.  His abdomen is soft, obese and nontender.  There are no palpable masses or organomegaly.  There is no peripheral edema.  Followup chest x-ray today shows postoperative changes in the right chest.  There is some stable tiny nodular changes in the left lung. There is no sign of recurrent cancer.  There is no pleural effusion.  IMPRESSION:  The patient continues to do well, coming up about 4 years following lung cancer surgery.  He continues to abstain from smoking. He does appear to be gaining some weight and I asked him to increase his physical activity level and watch his diet more closely and so that this is not becoming a problem.   I will plan to see him back in about 6 months with a repeat chest x-ray.  Evelene Croon, M.D. Electronically Signed  BB/MEDQ  D:  05/21/2010  T:  05/22/2010  Job:  [id number]  cc:   Colton Abed, MD, [facility] Sean A. Everardo All, MD

## 2010-06-05 NOTE — Letter (Signed)
Summary: Concepcion Living MD  Concepcion Living MD   Imported By: [place]ester [place] 05/29/2010 09:57:01  _____________________________________________________________________  External Attachment:    Type:   Image     Comment:   External Document

## 2010-06-05 NOTE — Letter (Signed)
Summary: [facility]: Office Visit  [facility]: Office Visit   Imported By: Earl Many 05/28/2010 17:06:41  _____________________________________________________________________  External Attachment:    Type:   Image     Comment:   External Document

## 2010-07-28 ENCOUNTER — Emergency Department (HOSPITAL_COMMUNITY)
Admission: EM | Admit: 2010-07-28 | Discharge: 2010-07-28 | Disposition: A | Discharge status: Home / Self Care | Payer: Medicare Other | Attending: Emergency Medicine | Admitting: Emergency Medicine

## 2010-07-28 DIAGNOSIS — R51 Headache: Secondary | ICD-10-CM | POA: Insufficient documentation

## 2010-07-28 DIAGNOSIS — G8929 Other chronic pain: Secondary | ICD-10-CM | POA: Insufficient documentation

## 2010-07-28 DIAGNOSIS — E785 Hyperlipidemia, unspecified: Secondary | ICD-10-CM | POA: Insufficient documentation

## 2010-07-28 DIAGNOSIS — Z85118 Personal history of other malignant neoplasm of bronchus and lung: Secondary | ICD-10-CM | POA: Insufficient documentation

## 2010-07-28 DIAGNOSIS — R5381 Other malaise: Secondary | ICD-10-CM | POA: Insufficient documentation

## 2010-07-28 DIAGNOSIS — I252 Old myocardial infarction: Secondary | ICD-10-CM | POA: Insufficient documentation

## 2010-07-28 DIAGNOSIS — R5383 Other fatigue: Secondary | ICD-10-CM | POA: Insufficient documentation

## 2010-07-28 DIAGNOSIS — M7989 Other specified soft tissue disorders: Secondary | ICD-10-CM | POA: Insufficient documentation

## 2010-07-28 DIAGNOSIS — I2510 Atherosclerotic heart disease of native coronary artery without angina pectoris: Secondary | ICD-10-CM | POA: Insufficient documentation

## 2010-07-28 DIAGNOSIS — M549 Dorsalgia, unspecified: Secondary | ICD-10-CM | POA: Insufficient documentation

## 2010-07-28 DIAGNOSIS — M79609 Pain in unspecified limb: Secondary | ICD-10-CM | POA: Insufficient documentation

## 2010-07-28 DIAGNOSIS — I10 Essential (primary) hypertension: Secondary | ICD-10-CM | POA: Insufficient documentation

## 2010-07-28 DIAGNOSIS — Y849 Medical procedure, unspecified as the cause of abnormal reaction of the patient, or of later complication, without mention of misadventure at the time of the procedure: Secondary | ICD-10-CM | POA: Insufficient documentation

## 2010-07-28 DIAGNOSIS — R21 Rash and other nonspecific skin eruption: Secondary | ICD-10-CM | POA: Insufficient documentation

## 2010-07-28 DIAGNOSIS — Z951 Presence of aortocoronary bypass graft: Secondary | ICD-10-CM | POA: Insufficient documentation

## 2010-07-28 DIAGNOSIS — IMO0001 Reserved for inherently not codable concepts without codable children: Secondary | ICD-10-CM | POA: Insufficient documentation

## 2010-07-28 DIAGNOSIS — T8062XA Other serum reaction due to vaccination, initial encounter: Secondary | ICD-10-CM | POA: Insufficient documentation

## 2010-07-29 ENCOUNTER — Other Ambulatory Visit: Payer: Medicare Other | Admitting: *Deleted

## 2010-07-31 ENCOUNTER — Encounter: Payer: Self-pay | Admitting: Cardiology

## 2010-07-31 DIAGNOSIS — J449 Chronic obstructive pulmonary disease, unspecified: Secondary | ICD-10-CM | POA: Insufficient documentation

## 2010-07-31 DIAGNOSIS — F329 Major depressive disorder, single episode, unspecified: Secondary | ICD-10-CM | POA: Insufficient documentation

## 2010-07-31 DIAGNOSIS — Z720 Tobacco use: Secondary | ICD-10-CM | POA: Insufficient documentation

## 2010-07-31 DIAGNOSIS — M1990 Unspecified osteoarthritis, unspecified site: Secondary | ICD-10-CM | POA: Insufficient documentation

## 2010-08-01 ENCOUNTER — Encounter: Payer: Self-pay | Admitting: Cardiology

## 2010-08-01 ENCOUNTER — Ambulatory Visit (INDEPENDENT_AMBULATORY_CARE_PROVIDER_SITE_OTHER): Payer: Medicare Other | Admitting: Cardiology

## 2010-08-01 DIAGNOSIS — I10 Essential (primary) hypertension: Secondary | ICD-10-CM

## 2010-08-01 DIAGNOSIS — Z951 Presence of aortocoronary bypass graft: Secondary | ICD-10-CM

## 2010-08-01 MED ORDER — AMLODIPINE BESYLATE 5 MG PO TABS: 5.0000 mg | ORAL_TABLET | Freq: Every day | ORAL | Status: DC
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
## 2010-08-01 NOTE — Assessment & Plan Note (Signed)
Patient is doing very well.  He has newly diagnosed hypertension.  This may be related to his recent addition of steroids in the reaction he had in his right arm.  Because his systolic is high will start a low dose of amlodipine.  I will then see him back for followup.

## 2010-08-01 NOTE — Patient Instructions (Signed)
Your physician recommends that you schedule a follow-up appointment in:  2 weeks with Dr. Myrtis Ser Your physician has recommended you make the following change in your medication: Start: Amlodipine 5mg  daily

## 2010-08-01 NOTE — Assessment & Plan Note (Signed)
Coronary disease is stable.  No further workup. 

## 2010-08-13 ENCOUNTER — Encounter: Payer: Self-pay | Admitting: Cardiology

## 2010-08-13 DIAGNOSIS — I10 Essential (primary) hypertension: Secondary | ICD-10-CM | POA: Insufficient documentation

## 2010-08-13 DIAGNOSIS — R9430 Abnormal result of cardiovascular function study, unspecified: Secondary | ICD-10-CM | POA: Insufficient documentation

## 2010-08-13 DIAGNOSIS — Z9081 Acquired absence of spleen: Secondary | ICD-10-CM | POA: Insufficient documentation

## 2010-08-13 DIAGNOSIS — Z951 Presence of aortocoronary bypass graft: Secondary | ICD-10-CM | POA: Insufficient documentation

## 2010-08-13 DIAGNOSIS — C3490 Malignant neoplasm of unspecified part of unspecified bronchus or lung: Secondary | ICD-10-CM | POA: Insufficient documentation

## 2010-08-13 DIAGNOSIS — IMO0002 Reserved for concepts with insufficient information to code with codable children: Secondary | ICD-10-CM | POA: Insufficient documentation

## 2010-08-13 DIAGNOSIS — I2510 Atherosclerotic heart disease of native coronary artery without angina pectoris: Secondary | ICD-10-CM | POA: Insufficient documentation

## 2010-08-14 ENCOUNTER — Ambulatory Visit (INDEPENDENT_AMBULATORY_CARE_PROVIDER_SITE_OTHER): Payer: Medicare Other | Admitting: Cardiology

## 2010-08-14 ENCOUNTER — Encounter: Payer: Self-pay | Admitting: Cardiology

## 2010-08-14 DIAGNOSIS — I2510 Atherosclerotic heart disease of native coronary artery without angina pectoris: Secondary | ICD-10-CM

## 2010-08-14 DIAGNOSIS — I10 Essential (primary) hypertension: Secondary | ICD-10-CM

## 2010-08-14 NOTE — Patient Instructions (Signed)
Your physician wants you to follow-up in: 1 year. You will receive a reminder letter in the mail two months in advance. If you don't receive a letter, please call our office to schedule the follow-up appointment.  
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
## 2010-08-14 NOTE — Assessment & Plan Note (Signed)
Coronary disease remains stable.  No change in therapy.  Plan one year followup.

## 2010-08-14 NOTE — Assessment & Plan Note (Signed)
Blood pressure is nicely controlled.  I had a careful discussion with the patient.  We will continue the amlodipine.
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
## 2010-08-27 NOTE — Assessment & Plan Note (Signed)
[facility]                            CARDIOLOGY OFFICE NOTE   NAME:[name], [name]                       MRN:          [id number]  DATE:06/29/2007                            DOB:          1940/07/02    [name] is seen for cardiology followup.  He looks great.  He is here  with his wife today.  They both stopped smoking around the time of his  cardiac diagnosis.  He is stable.  He has gained weight and looks much  healthier.  We talked carefully, however, about not gaining any more  weight.  In fact, he has even played golf.  He had CABG and removal of a  lung cancer within a few months of each other, and he is doing well.  He  has no chest pain.  He is going about full activities.  He is exercising  regularly.  His lipids look good.   PAST MEDICAL HISTORY:   ALLERGIES:  He felt very poorly with LIPITOR, but he is tolerating  simvastatin.   MEDICATIONS:  1. Aspirin 325 (dose to be reduced to 81 mg.  2. Metoprolol 25.  3. Simvastatin 80.  4. Citalopram 20.   OTHER MEDICAL PROBLEMS:  See the list below.   REVIEW OF SYSTEMS:  He feels great.  He has no major problems.  His  Review of Systems is negative.  In the past, he has had some problems  with depression.  He clearly looks and feels much better.   Otherwise, Review of Systems is negative.   PHYSICAL EXAMINATION:  VITAL SIGNS:  Blood pressure is 150/70 with a  pulse of 55.  GENERAL:  The patient is oriented to person, time and place.  Affect is  normal.  HEENT:  Reveals no xanthelasma.  He has normal extraocular motion.  LUNGS:  Clear.  CHEST:  His thoracotomy site is nicely healed.  His CABG site is nicely  healed.  CARDIAC:  Reveals S1-S2.  There are no clicks or significant murmurs.  ABDOMEN:  Soft.  EXTREMITIES:  No significant peripheral edema.   EKG reveals sinus bradycardia and no significant abnormalities.   Total cholesterol 150, triglycerides 72, HDL 64, LDL 71 on  simvastatin  80 mg.  This is excellent.   PROBLEMS:  Are listed on my note of November 11, 2006.  #3.  Post coronary artery bypass grafting on Sep 10, 2006.  He is  stable.  #5.  Status post thoracotomy for right upper lobe moderately  differentiated squamous cell carcinoma followed carefully by Dr. Evelene Croon.  #12.  Depressive disorder.  He is clearly better.   [name] is greatly improved, and I am pleased.  I will see him back in  1 year.  He knows to try to lose a few pounds.     [name], MD, [facility]  Electronically Signed    JDK/MedQ  DD: 06/29/2007  DT: 06/29/2007  Job #: [id number]   cc:   Gregary Signs A. Everardo All, MD  Evelene Croon, M.D.
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
## 2010-08-27 NOTE — Assessment & Plan Note (Signed)
[facility]                            CARDIOLOGY OFFICE NOTE   NAME:Mcray, BRANNDON TUITE                       MRN:          [id number]  DATE:01/01/2007                            DOB:          22-Sep-1940    SUBJECTIVE:  Mr. Tuminello is doing extremely well.  I saw him last on November 11, 2006.  He is post CABG and removal of his lung cancer and he is  doing well.  He had been depressed and this is definitely improving.  He  is going about full activities. He has mild discomfort in his right  anterior chest but otherwise he is doing very well.   PAST MEDICAL HISTORY:   ALLERGIES:  There are no known drug allergies.   MEDICATIONS:  1. Aspirin 325.  2. Metamucil.  3. Lipitor 80 (patient wants to change to simvastatin).  4. Metoprolol.  5. Eye drops.  6. Senokot.   OTHER MEDICAL PROBLEMS:  See the complete list on the note of November 11, 2006.   REVIEW OF SYSTEMS:  He is feeling well.  His depression is greatly  improved.  He has vague discomfort of his right anterior chest and  otherwise he is doing very well.  He also mentions some fullness in the  palm of his left hand.  His review of systems is otherwise negative.   PHYSICAL EXAMINATION:  VITAL SIGNS:  Blood pressure 145/88, pulse 60.  GENERAL APPEARANCE:  The patient is oriented to person, time and place  and his affect is normal.  He is here with his wife today.  HEENT:  Reveals no xanthelasma.  He has normal extraocular motion.  NECK:  There are no carotid bruits.  There is no jugular venous  distention.  LUNGS:  Clear.  Respiratory effort is not labored.  CARDIAC:  Exam reveals an S1 with an S2.  There are no clicks or  significant murmurs.  ABDOMEN:  Soft.  EXTREMITIES: He has no peripheral edema.  The patient has some fullness  in the synovial coverings of the bones and tendons in his left hand.  There is no heat or redness.  FULLNESS IN SYNOVIAL COVERINGS, BONES AND TENDONS, LEFT HAND:  I  have  suggested that he follow up with Dr. Everardo All or a hand physician if this  bothers him.  I doubt that it is a significant abnormality.   LABORATORY DATA:  The patient did have labs today including a CBC, liver  functions and thyroid.  This was done on December 24, 2006.  The  thyroid is normal.  Liver functions are normal.  Hemoglobin is 11.6.  This is post multiple procedures.  He will eventually need followup to  check his hemoglobin on an ongoing basis.   PROBLEM:  Problems are listed on the note of November 11, 2006.  His overall  cardiac status is quite stable.   PROBLEM #11:  DYSLIPIDEMIA:  We will check a fasting lipid on his high  dose Lipitor.  We will then switch him to simvastatin at his choice, and  follow him.  NEXT PROBLEM:  HEMOGLOBIN OF 11.6:  This will need to be followed over  time.     [name], MD, [facility]  Electronically Signed    JDK/MedQ  DD: 01/01/2007  DT: 01/01/2007  Job #: [id number]   cc:   Gregary Signs A. Everardo All, MD

## 2010-08-27 NOTE — Assessment & Plan Note (Signed)
OFFICE VISIT   ABBOTT, JASINSKI  DOB:  September 30, 1940                                        November 07, 2008  CHART #:  [id number]   The patient returns today for follow up status post right thoracotomy  and right upper lobectomy for a T1 N0 MX, moderately differentiated  squamous cell carcinoma on September 30, 2006.  I last saw him on April 25, 2008 at which time a followup CT scan of the chest showed no evidence of  recurrent cancer.  He continues to abstain from smoking since the time  of surgery.  He said his weight has been up and down since I last saw  him, but overall, has remained relatively stable.  He still has  occasional chest wall pains around the right thoracotomy incision which  seems to be made worse by playing golf.  He has been playing golf about  3 days per week.  He has had no cough or sputum production.  He denies  any headaches or visual changes.  He has had no other bone or muscle  pain.  He has good appetite.   PHYSICAL EXAMINATION:  VITAL SIGNS:  Today, his blood pressure is  151/82, pulse 58 and regular, respiratory rate 16 and unlabored.  Oxygen  saturation on room air is 96%.  GENERAL:  He looks well.  CARDIAC:  Regular rate and rhythm with normal heart sounds.  LUNG:  Clear.  CHEST:  The right thoracotomy incision is well-healed.  There are no  skin lesions.  NECK:  There is no cervical or supraclavicular adenopathy.   Follow up chest x-ray today shows stable appearance of the lungs.  There  is no evidence of recurrent cancer.   IMPRESSION:  The patient continues to do well following lung cancer  surgery.  He has no evidence of recurrence.  I did write him a  prescription for Ultram 50 mg p.o. q.6 h. p.r.n. for pain #30 with 2  refills to see if this helps his continued thoracotomy chest wall pain.  I also told him,  he could try taking some ibuprofen as directed on the bottle in addition  to this.  I will plan to see him back in  the office in 6 months with a  chest x-ray for follow up.   Evelene Croon, M.D.  Electronically Signed   BB/MEDQ  D:  11/07/2008  T:  11/07/2008  Job:  [id number]

## 2010-08-27 NOTE — Op Note (Signed)
[name], [name] NO.:  [id number]   MEDICAL RECORD NO.:  [id number]          PATIENT TYPE:  INP   LOCATION:  2301                         FACILITY:  [facility]   PHYSICIAN:  Evelene Croon, M.D.     DATE OF BIRTH:  [birth year]   DATE OF PROCEDURE:  09/30/2006  DATE OF DISCHARGE:                               OPERATIVE REPORT   PREOPERATIVE DIAGNOSIS:  Right upper lobe lung mass.   POSTOPERATIVE DIAGNOSIS:  Right upper lobe lung mass.   OPERATIVE PROCEDURE:  1. Flexible fiberoptic bronchoscopy.  2. Right thoracotomy and right upper lobectomy with mediastinal lymph      node excision.   ATTENDING SURGEON:  Evelene Croon, MD   ASSISTANT:  Ammie Ferrier, PA-C   ANESTHESIA:  General endotracheal.   CLINICAL HISTORY:  This patient is a 70 year old gentleman who underwent  coronary artery bypass surgery x 7 on Aug 13, 2006.  His preoperative  chest x-ray showed a right upper lobe lung mass suspicious for cancer.  He had a PET scan and CAT scan that showed a right upper lobe lung mass  suspicious for cancer with increased uptake on the PET scan.  There was  also slightly increased uptake in the area of the right hilar area that  could be adenopathy and this was weakly positive and could be metastatic  disease or could also be inflammatory granulomatous disease.  The SUV in  the lymph node area was 2.3.  There were no other areas of increased  uptake on PET scan and the CT scan of the head was negative.  His PFTs  were acceptable for lobectomy.  He has recovered well from his heart  surgery and we felt it was time to proceed with lobectomy.  I discussed  the operative procedure with him and his wife including alternatives,  benefits, and risks including bleeding, blood transfusion, infection,  persistent air leak from the lung, inability to completely resect the  tumor and the possibility that he may already have lymph node  metastasis.  He understood all this and  agreed to proceed.   OPERATIVE PROCEDURE:  The patient was taken to the operating room and  placed on the table in a supine position.  After induction of general  endotracheal anesthesia using a single-lumen tube, flexible fiberoptic  bronchoscopy was performed.  The distal trachea appeared normal.  The  carina was sharp.  The left bronchial tree had normal segmental anatomy  with no sign of extrinsic compression.  There was a small amount of  mucus within the bronchi.  There were no other lesions seen.  The right  bronchial tree also had normal segmental anatomy with no endobronchial  lesions.  I was able to pass the bronchoscope out into the segmental  bronchi of the right upper lobe and these were all free of abnormalities  as far out as I could see.  The bronchoscope was then withdrawn from the  patient.  The single-lumen tube was converted to a double-lumen tube by  Anesthesiology.  The patient was then positioned in the left lateral  decubitus position with the right side up.  The right side of the chest  was prepped with Betadine soaping solution and draped in the usual  sterile manner.  Another time-out was taken and the proper patient,  proper operative side and proper operation were confirmed with nursing  and anesthesia staff.  Then the right chest was opened through a lateral  thoracotomy incision.  The latissimus muscle was quite broad, extending  far anteriorly, and this had to be divided about halfway.  The serratus  muscle was retracted anteriorly and not divided.  The pleural space was  entered through the 5th intercostal space.  Examination of the pleural  space showed no abnormality.  The right upper lobe was palpated and  there was an obvious mass present with dimpling of the lung over it.  The right upper lobe had no other lesions palpable.  The right middle  and lower lobes were also palpated and had no palpable abnormalities.  Then the mediastinal pleura over the  hilum was divided to expose the  right hilar vessels.  The right upper lobe had 3 segmental arteries; the  first 2 were encountered and were divided using a vascular stapler.  The  3rd artery was visible further down and appeared to be the posterior  segmental artery.  The superior pulmonary vein had 2 branches to the  upper lobe and these were also encircled and divided using a vascular  stapler.  The superior pulmonary vein branch to the middle lobe was  preserved.  Then attention was turned to the major fissure.  It was  opened and the interlobar portion of the pulmonary artery was  identified.  The posterior segmental artery was seen and was encircled  and divided using a vascular stapler.  There were no other vascular  branches.  Then the remainder of the major fissure was divided using a  GIA linear stapler.  There was an incomplete fissure between the upper  and middle lobe and this was also divided using the GIA stapler.  Then  the lung was retracted anteriorly and the right upper lobe bronchus was  identified and encircled.  There were multiple lymph nodes present  around the right upper lobe bronchus and these were taken with the  specimen.  The bronchus was divided using a TL-30 stapler.  Before  firing the stapler, the right lung was inflated and it was confirmed  that the right middle and lower lobes both inflated without difficulty.  The stapler was then fired and the right upper lobe bronchus divided  beyond the stapler.  The specimen was passed off the table.  There were  also a few interlobar lymph nodes which were removed and sent as a  separate specimen.  There is also a lymph node around the right mainstem  bronchus which was sent to Pathology.  Some of these hilar lymph nodes  had calcium palpable in them.  Then the chest was filled with saline and  the lung inflated to 40-cm pressure and there was no air leak from the bronchial stump.  There was a slight air leak from  the divided section  of the fissures and this was coated with CoSeal to stop the air leaks.  Then the inferior pulmonary ligament was divided.  The right middle lobe  still had some attachment to the lower lobe and therefore I did not have  to pex the 2 together.  Then two 28-French chest tubes were brought  through  separate stab incisions and positioned with 1 posteriorly and 1  anteriorly.  There was complete hemostasis.  The mediastinal lymph node  dissection was then performed.  The subcarinal area had a small lymph  node which was removed and sent to Pathology.  There were also a few  small lymph nodes in the right paratracheal region that were  unimpressive and were sent to Pathology.  I did not see any other lymph  nodes that I could remove.  Then the ribs were reapproximated with #2  Vicryl pericostal sutures.  The right lung was then reinflated under  direct vision.  These sutures were tied.  Then the serratus muscle was  returned to its normal anatomic position and the latissimus dorsi muscle  was closed with a continuous #1 Vicryl suture.  The subcutaneous tissue  was closed with continuous 2-0 Vicryl and the skin with a 3-0 Vicryl  subcuticular closure.  The sponge, needle and instrument counts were  correct according to the scrub nurse.  Dry sterile dressing were applied  over the incision and around the chest tubes, which were hooked to Pleur-  evac suction.  Then an epidural catheter was inserted by Anesthesiology.  The patient was then positioned in the supine position and extubated and  transported to the postanesthesia care unit in satisfactory and stable  condition.      Evelene Croon, M.D.  Electronically Signed     BB/MEDQ  D:  10/01/2006  T:  10/02/2006  Job:  [id number]

## 2010-08-27 NOTE — Assessment & Plan Note (Signed)
OFFICE VISIT   [name], Colton  DOB:  [birth year]                                        October 20, 2006  CHART #:  [id number]   The patient returns to the office today for first postoperative visit  status post bronchoscopy, right thoracotomy, and right upper lobectomy  with mediastinal lymph node dissection.  His pathologies showed a T1 N0  MX moderately differentiated squamous cell carcinoma.  His maximum tumor  size was 2.4 cm.  His postoperative course was uncomplicated.  His only  complaint since discharge is that he has been having severe  constipation.  He and his wife say that he has taken 2 bottles of  magnesium citrate as well as Milk of Magnesia, Dulcolax, prune juice,  and Fleet's enema x2 without much result.  He said he has had some small  amounts of liquid stool after Fleet's enema.  He denies any specific  abdominal pain, says he does feel a little bloated and has not been  eating much.  He has had no nausea, vomiting.   PHYSICAL EXAMINATION:  His blood pressure is 151/88 and his pulse is 77  and regular, respiratory rate is 18 and unlabored.  Oxygen saturation on  room air is 99%.  He looks anxious, but in no distress.  His lung exam  is clear.  The right thoracotomy incision is well-healed.  Cardiac exam  shows a regular rate and rhythm with normal heart sounds.  There is no  peripheral edema.   Followup chest x-ray shows clear lung fields and no pleural effusions.   MEDICATIONS:  1. Lopressor 25 mg daily.  2. Lipitor 80 mg daily.  3. Neurontin 300 mg t.i.d.  4. Aspirin 325 mg daily.  5. Tylox p.r.n. for pain.   IMPRESSION:  The patient is doing fairly well overall status post  lobectomy for lung cancer.  He has stage 1 disease and does not require  any further treatment.  He will need continued close followup with chest  x-ray every 6 months.  He does appear to be constipated.  I asked him to  start taking MiraLax which he has at  home.  We will give him about 3  more days of taking this to see if that helps with his constipation.  If  it does not, then I asked him to call my office so that we can make  further plans.  He may need to be brought into the emergency room for  digital disimpaction or high enema.  If his bowels start working then I  will plan to see him back in followup in about 3 months with a chest x-  ray.   Evelene Croon, M.D.  Electronically Signed   BB/MEDQ  D:  10/20/2006  T:  10/21/2006  Job:  [id number]

## 2010-08-27 NOTE — Assessment & Plan Note (Signed)
OFFICE VISIT   [name], [name]  DOB:  [birth year]                                        January 19, 2007  CHART #:  [id number]   The patient returns today for follow-up status post right thoracotomy  and right upper lobectomy on September 30, 2006.  His pathology showed T1,  N0, MX moderately differentiated squamous cell carcinoma.  His immediate  postoperative course was uncomplicated but after discharge, he did not  have a bowel movement for several weeks and returned with fecal  impaction requiring disimpaction and recurrent enemas to try to get his  bowels going again.  This gradually resolved and he was discharged on a  bowel regimen to prevent this from recurring.  Since discharge, he says  that he has been feeling much better.  He has been having regular bowel  movements.  He is still taking some pain medication due to some right-  sided chest wall pain.  His wife reports that he has been showing some  signs of ongoing depression, does not feel like doing anything.  He said  that he has no shortness of breath at all.   PHYSICAL EXAMINATION:  VITAL SIGNS:  Blood pressure is 152/85, pulse 62  and regular, respirations 18, oxygen saturation 96% on room air.  GENERAL APPEARANCE:  He looks well.  LUNGS:  Clear.  His recent median sternotomy incision is well healed and  the sternum is stable.  His right thoracotomy incision is well healed.  CARDIOVASCULAR:  Regular rate and rhythm with normal heart sounds.  EXTREMITIES:  There is no peripheral edema.   Follow-up chest x-ray today shows no active disease.   IMPRESSION:  The patient is about three months status post lung cancer  resection.  He is doing well overall with the exception of some symptoms  of depression which I think are probably present preoperatively.  I  encouraged him to get out and do as many activities as he can.  I told  him he could return to normal physical activity.  If his symptoms of  depression do not improve soon, he will discuss this with his primary  physician to see whether an antidepressant may be  indicated.  I told him I would like to see him back in about three  months with a follow-up chest x-ray and he will need to be followed  closely for the next five years.   Evelene Croon, M.D.  Electronically Signed   BB/MEDQ  D:  01/19/2007  T:  01/20/2007  Job:  [id number]   cc:   Gregary Signs A. Everardo All, MD
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
## 2010-08-27 NOTE — Assessment & Plan Note (Signed)
OFFICE VISIT   [name], [name]  DOB:  October 02, 1940                                        October 23, 2009  CHART #:  [id number]   HISTORY:  The patient returned to my office today for followup status  post right thoracotomy and right upper lobectomy for T1 N0 Mx moderately  differentiated squamous cell carcinoma on September 30, 2006.  Since I last  saw him on April 17, 2009, he has continued to feel well.  His weight  has been stable.  He continues to abstain from smoking.  He has had no  headaches or visual changes.  He denies any cough, sputum, or  hemoptysis.  He has had no muscle or bone pain.   PHYSICAL EXAMINATION:  Today, his blood pressure is 153/79, pulse is 56  and regular, and respiratory rate is 18, unlabored.  Oxygen saturation  on room air is 97%.  He looks well.  He does have a brownish-black  raised mole on the right temple, which looks more prominent to me than  before.  On his right upper back, there is also a similar appearing  blackish-brown raised mole.  There is no cervical, supraclavicular, or  axillary adenopathy.  His lungs are clear.  The right thoracotomy  incision is well healed.  There are no skin lesions.  Cardiac exam shows  regular rate and rhythm with normal heart sounds.  Abdominal exam shows  active bowel sounds.  His abdomen is soft, mildly obese, and nontender.  There are no palpable masses or organomegaly.  There is no peripheral  edema.   DIAGNOSTIC TESTS:  Chest x-ray today shows postoperative changes in the  right hemithorax.  There is no pleural effusion.  There is no sign of  cancer.   IMPRESSION:  The patient is doing well, approximately 3 years following  lung cancer surgery.  I will plan to see him back in 6 months with a  repeat chest x-ray.   Evelene Croon, M.D.  Electronically Signed   BB/MEDQ  D:  10/23/2009  T:  10/24/2009  Job:  [id number]

## 2010-08-27 NOTE — Assessment & Plan Note (Signed)
OFFICE VISIT   [name], [name]  DOB:  Oct 10, 1940                                        September 15, 2006  CHART #:  [id number]   SUBJECTIVE:  [name] returns for his first postoperative visit status  post coronary bypass graft surgery x7 on 08/13/2006.  He also has a  right upper lobe lung cancer that requires resection when he has made  adequate recovery from his heart surgery.  He has been feeling fairly  well overall and is walking some daily without chest pain or shortness  of breath.  He is somewhat limited due to his degenerative spine disease  with pain in his back and leg.  He has had reduced appetite but this was  present preoperatively.  He said he has lost about 5 pounds since coming  home from heart surgery.  He has noticed some symptoms of depression.   OBJECTIVE:  On physical examination today his blood pressure is 154/82,  his pulse is 60 and regular, respiratory rate is 18 and unlabored,  oxygen saturation on room air is 98%.  Cardiac exam shows regular rate  and rhythm with normal heart sounds.  His lung exam is clear.  The chest  incision is healing well and the sternum is stable.  His leg incision is  healing well and there is no lower extremity edema.   X-RAYS:  Follow up chest x-ray shows the right upper lobe lung mass is  barely visible on chest x-ray.  Lungs are otherwise clear.  There are no  pleural effusions.   MEDICATIONS:  Aspirin 325 mg daily, Neurontin 300 mg 4 times per day,  Lipitor 80 mg daily, Toprol XL 25 mg daily, hydrocodone/APAP 10/325  about 4 times per day for pain.   IMPRESSION/PLAN:  [name] is making a good recovery from his heart  surgery and symptomatically feels much better.  He does have a right  upper lobe lung mass that is felt to be cancer noted on preoperative  chest x-ray and CAT scan.  He had a PET scan preoperatively that showed  increased uptake within the right upper lobe lung lesion as well as a  slightly increased uptake in an area of right hilar adenopathy.  This  area of uptake was weakly positive and certainly could be a metastatic  disease but it could also be inflammatory granulomatous disease.  The  SUV in the lymph node was 2.3.  There were no other areas of increased  uptake on the PET scan and a CT scan of the head was negative.  His  PFT's were acceptable for lobectomy.  I think he has made adequate  recovery to proceed with his lung surgery.  We will schedule this for  about 2 weeks from now on 06/18 and I will plan on seeing him back in  the office on 06/17 for follow up and answer any further questions.  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  I asked  him not to lift anything heavier than 10 pounds for a total of 3 months  from date of surgery.  I asked him to try to eat as much as possible and  maintain good protein intake.   Evelene Croon, M.D.  Electronically Signed   BB/MEDQ  D:  09/15/2006  T:  09/15/2006  Job:  [id number]   cc:   [name], MD, [facility]

## 2010-08-27 NOTE — Assessment & Plan Note (Signed)
OFFICE VISIT   [name], [name]  DOB:  November 16, 1940                                        April 17, 2009  CHART #:  [id number]   The patient returned to my office today for followup status post right  thoracotomy and right upper lobectomy for T1N0MX moderately  differentiated squamous cell carcinoma on September 30, 2006.  I last saw him  on November 07, 2008.  Since his last followup, he said he has been feeling  well and is continuing to play golf about 3 days per week.  He no longer  smokes.  He denies any headaches or visual changes.  He has had no cough  or sputum production and no hemoptysis.  He denies any muscle or bone  pain.  His weight has been stable and appetite has been good.   On physical examination today, blood pressure 133/82, pulse is 67 and  regular, and respiratory rate 18 and unlabored.  Oxygen saturation on  room air is 98%.  He looks well.  There is no cervical or  supraclavicular adenopathy.  There is no axillary adenopathy.  His lungs  are clear.  Cardiac exam shows regular rate and rhythm with normal heart  sounds.  The right thoracotomy incision is well healed.  There are no  skin lesions.  Abdominal exam shows active bowel sounds.  His abdomen is  soft, mildly obese, and nontender.  There are no palpable masses or  organomegaly.  There is no peripheral edema.   Followup chest x-ray today shows postoperative changes on the right.  There is no sign of recurrent cancer.  There is no pleural effusion.   IMPRESSION:  The patient is doing well about 2-1/2 years following lung  cancer surgery.  I will plan to see him back in about 6 months with a  repeat chest x-ray.   Evelene Croon, M.D.  Electronically Signed   BB/MEDQ  D:  04/17/2009  T:  04/18/2009  Job:  [id number]

## 2010-08-27 NOTE — Assessment & Plan Note (Signed)
OFFICE VISIT   [name], [name]  DOB:  01-16-1941                                        October 19, 2007  CHART #:  [id number]   HISTORY:  The patient returned today for followup status post right  thoracotomy and right upper lobectomy for a T1, N0, Mx moderately  differentiated squamous cell carcinoma on September 30, 2006.  He has been  feeling well overall but still has mild right chest incisional  discomfort.  He has been using an exercise machine at home and has been  playing a lot of golf.  He continues to abstain from smoking.  He has  gained some weight which he attributes to being able to taste food since  not smoking.  He has had no chest pain or pressure.  He has noticed no  unusual lumps or bumps.  He has had no headaches or visual changes.   PHYSICAL EXAMINATION:  VITAL SIGNS:  His blood pressure is 138/82 and  his pulse is 58 and regular.  Respiratory rate is 18 and unlabored.  Oxygen saturation on room air is 96%.  GENERAL:  He looks well.  CARDIAC:  Regular rate and rhythm with normal heart sounds.  LUNG:  Clear.  CHEST:  The right thoracotomy incision is well healed.  There are no  skin lesions.  There is no supraclavicular, cervical, or axillary  adenopathy.  ABDOMEN:  Active bowel sounds.  His abdomen is soft and nontender.  There are no palpable masses or organomegaly.   IMAGING STUDIES:  A followup chest x-ray today shows chronic scarring at  the right base related to his surgery but no acute changes.  There is no  sign of recurrent cancer.   IMPRESSION:  The patient is 1 year status post lung cancer resection  without evidence of recurrence.   PLAN:  I will plan to see him back in 6 months and will do a CT scan of  the chest without contrast at that time.   Evelene Croon, M.D.  Electronically Signed   BB/MEDQ  D:  10/19/2007  T:  10/19/2007  Job:  [id number]

## 2010-08-27 NOTE — Assessment & Plan Note (Signed)
OFFICE VISIT   [name], [name]  DOB:  [birth year]                                        April 20, 2007  CHART #:  [id number]   [name] returned for follow-up today status post right thoracotomy and  right upper lobectomy on September 30, 2006, for a T1, N0, NX moderately  differentiated squamous cell carcinoma.  He has been feeling well  overall and has minimal chest wall discomfort.  He has noticed no  unusual lumps or bumps.  He has been walking daily.  He continues to  abstain from smoking  He lost about 30 pounds after his lung surgery but  has gained about 15 of that back.   PHYSICAL EXAMINATION:  VITAL SIGNS:  Today his blood pressure is 145/82,  pulse 62 and regular, respiratory rate 18 and unlabored, oxygen  saturation on room air is 95%.  GENERAL APPEARANCE:  He looks well.  CARDIAC:  Regular rate and rhythm with normal heart sounds.  LUNGS:  Clear. The right thoracotomy incision is well healed.  There are  no skin lesions.  LYMPHS:  There is no supraclavicular or cervical adenopathy.  ABDOMEN:  Active bowel sounds.  His abdomen is soft and nontender.  There are no palpable masses or organomegaly.   Follow-up chest x-ray today shows postoperative changes with no acute  process and no sign of recurrent cancer.   IMPRESSION:  [name] is doing well following his lung cancer surgery.  He has stage 1A disease and does not require any chemotherapy  postoperatively.  I will plan to see him back in about six months with a  follow-up chest x-ray.   Evelene Croon, M.D.  Electronically Signed   BB/MEDQ  D:  04/20/2007  T:  04/20/2007  Job:  [id number]   cc:   Gregary Signs A. Everardo All, MD
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
## 2010-08-27 NOTE — Assessment & Plan Note (Signed)
OFFICE VISIT   [name], CHIASSON  DOB:  1940-06-19                                        April 25, 2008  CHART #:  [id number]   The patient returned today for followup status post right thoracotomy  and right upper lobectomy for a T1 N0 Mx moderately differentiated  squamous cell carcinoma on September 30, 2006.  I last saw him on October 19, 2007, at which time, he was doing well.  Since I last saw him, he said  that he has gained significant weight, which he relates to completely  quitting smoking and not watching his diet closely enough.  He has been  playing some golf and uses his stationary bicycle and rolling machine  about 15-30 minutes per day.  He said he spends most of this day  watching TV.  His weight is increased by about 65 pounds since surgery  according to him.  His main complaint is of some right anterior chest  wall aching pain, which he said is there all the time, but gets worse  with physical activity.  He has been using some ibuprofen and mild  narcotic pain medicine for this at times.  He denies any headaches or  visual changes.  He has noticed no other lumps or bumps.   PHYSICAL EXAMINATION:  VITAL SIGNS:  Today, his blood pressure is  143/82, his pulse is 56 and regular.  Respiratory rate is 18 and  unlabored.  Oxygen saturation on room air is 98%.  GENERAL:  He looks like he has gained significant weight from the last  time I saw him.  CARDIAC:  Regular rate and rhythm with normal heart sounds.  LUNGS:  Clear.  The right chest incision is well healed.  There are skin  lesions.  There is no chest wall deformity or tenderness.   A followup CT scan of the chest today shows no evidence of locally  recurrent lung cancer in the right chest.  There are scattered tiny lung  nodules in the left upper and lower lobe, which are indeterminate.  Some  of these were visualized on prior exam, but some were not definitely  seen previously.  There is no  mediastinal or hilar adenopathy.   IMPRESSION:  The patient is now about 1-1/2 years status post lung  cancer surgery.  He has no evidence of recurrence.  He continues to  abstain from smoking.  He has gained significant weight.  I encouraged  him to continue being physically active and to try to increase the  amount of physical activity he is getting.  I also encouraged him to  watch his diet more closely.  There is no evidence of recurrent cancer  by CT scan.  I suspect that the scattered left lung nodules are  insignificant since most of them have been seen on prior exams.  I will  plan to see him back in about 6 months with a repeat chest x-ray for  followup.   Evelene Croon, M.D.  Electronically Signed   BB/MEDQ  D:  04/25/2008  T:  04/25/2008  Job:  [id number]   cc:   [name], MD, [facility]

## 2010-08-30 NOTE — Assessment & Plan Note (Signed)
Tuesday, May 19, 2006:   Mr. Donoghue returns today with scheduled for epidural injection, however  noted that he has been taking Ibuprofen 600 mg b.i.d.  He states that he  has had continued pain, it almost feels like bone pain in his left leg,  his x-ray looks normal of that.  He states that his last epidural was  not particularly helpful, has done well with previous epidurals, though  the one before that was October 11which is left S1.  In further review  it looks like actually the last one did help, however it is worn off  now.   His pain level is at graded a 9 out of 10, pain with general activity is  7 out of 10, sleep is fair.  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   REVIEW OF SYSTEMS:  Positive for depression but negative for suicidal  thoughts.  He has some constipation and some night sweats.   EXAMINATION:  In GENERAL:  No acute distress.  Mood and affect  appropriate, gait is normal.   Review of his medications, he is out of his Hydrocodone, but the bottle  is dated February 04, 2006.  Did review the fact that his x-ray on  April 29, 2006 was normal.   IMPRESSION:  Radiculitis left S1.   PLAN:  Will stop the Ibuprofen and have him come back next week for a  injection.  He agrees to this, understands that this is being done to  minimize the risk of epidural bleeding.      Erick Colace, M.D.  Electronically Signed     AEK/MedQ  D:  05/19/2006 17:12:45  T:  05/19/2006 18:39:56  Job #:  [id number]   cc:   Brantley Stage, M.D.  Fax: [phone]

## 2010-08-30 NOTE — Assessment & Plan Note (Signed)
MEDICAL RECORD NUMBER:  [id number]   [name] is a 70 year old married gentleman who is accompanied by his wife  this morning for a recheck.   He was initially seen in our pain and rehabilitative clinic May 14, 2005.  He has a history of lumbago, symptoms of lumbar stenosis,  anterolisthesis L5-S1, and bilateral spinal stenosis at the L5 foramina with  diminished ambulation capacity.   He is back in today for a brief recheck.  He had continued to take the  hydrocodone 5/500 per Dr. Everardo All over the last several months and has about  10 or 11 pills left today.  He states that the medicines are not working  quite as well as they initially were.  Prior to starting the Vicodin he had  been taking multiple doses of 800 mg of ibuprofen throughout the day.   Today he states his pain is localized to the low back and with activity goes  into the left leg, particularly the left ankle region.  His average pain is  about a 7 in the low back area, 0 in the left leg when he is at rest.  With  activity it goes up to between a 7 and a 9.  He describes the pain in the  leg as burning and fairly constant and aching in the low back.   His sleep is overall fair.  He does not take any medications prior to going  to bed.  He gets fair relief with the current medications that he is on  right now.  Three months ago he was getting good relief with the Vicodin.  He can walk a few minutes at a time before the leg starts to bother him and  admits to some constipation.  Denies any suicidal ideation, depression,  anxiety.  Denies bowel or bladder control problems.   No changes in past medical, social or family history since our last visit.   EXAMINATION:  Blood pressure 157/80, pulse 76, respirations 17, 96%  saturated on room air.   He is a well-developed, well-nourished gentleman who appears his stated age.  He is oriented x3.  His affect is bright, alert, cooperative, and pleasant.  He is able to  transition from sit to stand without difficulty. No pain  behavior is noted.  Gait displays normal heel-toe mechanics, normal base of  support, symmetric stride length.  Tandem gait is normal, Romberg's test is  negative.  He stands with good balance with his lumbar spine flattened and  slightly flexed forward.   His reflexes are evaluated in the seated position.  Patellar tendon reflexes  are 2+ and symmetric, 2+ reflexes noted at the right ankle, 0 at the left  ankle is noted.   Straight leg raise negative.  He has good strength in the lower extremities  at hip flexors, knee extensors, dorsiflexors, plantar flexors. Numbness  noted along the left lateral leg.   IMPRESSION:  1.  Lumbago.  2.  Symptoms of lumbar stenosis.  3.  Anterolisthesis L5 on S1.  4.  Spinal stenosis noted at bilateral L5 foramina with diminished      ambulation capacity.   PLAN:  Again, I review concept of tolerance with Mr. Garber.  At this point we  would like to add back some of the ibuprofen.  A prescription was written  for him, 600 mg to take one to two tablets on a p.r.n. basis of the  ibuprofen.  We will also refill his hydrocodone.  Will start him on Norco  7.5/325 one to two p.o. daily, #60.  He has been using the Lidoderm and has  found that to be quite beneficial.  He uses it especially when he is playing  golf.  He uses approximately two to three patches during that time.   He has also trialed a TENS unit, has not found this to be of significant  benefit.  We will not order a unit for him.   We further discussed the possibility of adding Lyrica or Neurontin.  He is  not interested in these medications currently.  We also discussed the  possibility of an epidural steroid injection to help with the leg pain, not  so much the back pain.  He would like to think about this further.  We will  see him back in a month, will be monitoring his narcotic usage and overall  function.            ______________________________  Brantley Stage, M.D.     DMK/MedQ  D:  07/25/2005 09:26:26  T:  07/25/2005 10:25:50  Job #:  [id number]   cc:   Gregary Signs A. Everardo All, M.D. [facility]  [address]  [place]  [place] [address]

## 2010-08-30 NOTE — Procedures (Signed)
NAMEJARY, LOUVIER NO.:  [id number]   MEDICAL RECORD NO.:  [id number]          PATIENT TYPE:  REC   LOCATION:  [facility]                          FACILITY:  [facility]   PHYSICIAN:  Erick Colace, M.D.DATE OF BIRTH:  1941/04/14   DATE OF PROCEDURE:  01/22/2006  DATE OF DISCHARGE:                                 OPERATIVE REPORT   PROCEDURE:  Left S1 transforaminal epidural steroid injection under  fluoroscopic guidance.   INDICATIONS FOR PROCEDURE:  Left lower extremity radicular pain previously  relieved x3 months with a left S1 transforaminal epidural steroid injection  under fluoroscopic guidance performed on September 26, 2005. He has a history of  anterior listhesis L5 on S1. The pain is only partially relieved with  narcotic analgesics.   Informed consent was obtained after describing the risks and benefits to the  patient. These include bleeding, bruising, infection, loss of bowel and  bladder function, temporary or permanent paralysis. She elected to proceed  and has given written consent.   The patient was placed prone on the fluoroscopy table, Betadine prep,  sterilely draped, 25-gauge inch and a half needles were used to anesthetize  the skin and subcu tissue with 1% lidocaine x3 mL and a 22-gauge 3-1/2 inch  spinal needle was inserted under fluoroscopic guidance into the left S1  foramen. AP and lateral imaging demonstrated proper needle location.  Omnipaque 180 x 0.5 mL demonstrated good pyramidal spread proceeding  medially.   Remaining solution containing 1 mL of 40 mg per mL Depo-Medrol and 2 mL of  1%MPF  lidocaine injected. The patient tolerated the procedure well. Pre and  post injection vitals stable, post injection instructions  given. He will  followup with Dr. Pamelia Hoit. He can be reinjected in approximately 3 months  if this one wears off at that time.      Erick Colace, M.D.  Electronically Signed     AEK/MEDQ  D:   01/22/2006 15:00:49  T:  01/24/2006 07:24:48  Job:  [id number]   cc:   Brantley Stage, M.D.  Fax: [phone]

## 2010-08-30 NOTE — Assessment & Plan Note (Signed)
REASON FOR VISIT:  [name] is back in for a brief recheck with refill of  his medications.  He was last seen by me on January 07, 2006.  At that  time, he was requesting his third epidural injection.  He underwent left S1,  transforaminal epidural injection by Dr. Wynn Banker on January 22, 2006.   He reports improvement of his pain.  His average pain is about a 3-4/10 and  is described as dull and localized to the low back and into the left lower  extremity.  Walking is limited to about minutes.  He is able to climb stairs  and he is driving.   He is independent with his self-care.  In the last 5 months, he has not used  more than 100 10/325 hydrocodone tablets.   No changes in past medical, social or family history.  He continues to smoke  two packs a day and is cautioned against this.  He maintains contact with  Dr. Gregary Signs A. Everardo All.   PHYSICAL EXAMINATION:  VITAL SIGNS:  Blood pressure 131/68, pulse 69,  respirations 16, 97% saturated on room air.  GENERAL:  A well-developed, well-nourished gentleman who appears his stated  age.  He is oriented x3.  NEUROLOGIC:  His speech is clear and follows commands without difficult.  His affect is bright.  He is alert, cooperative and pleasant.  He  transitions from sit to stand without any difficulty.  He has excellent  forward flexion in the lumbar spine, mild limitations in extension.  He is  not painful during these maneuvers, however.  He does have a thoracic  kyphosis, but nontender spine.  Reflexes are symmetric at the patellar  tendon at 2+, 2+ at the right ankle, 0 at the left ankle.  Motor strength is  excellent in the lower extremities, in hip flexors, extensor, dorsiflexor  and plantar flexors.  Internal and external rotation of the hips are not  painful for him as well.   IMPRESSION:  1. Lumbago.  2. Symptoms of lumbar stenosis with diminished ambulation capacity.  3. Anterior listhesis of L5-S1.  4. Left sciatic type  symptoms worse with ambulation.  5. Nicotine addiction.  6. Thoracic kyphosis.   PLAN:  [name] has done quite well on the epidural steroid injections.  Since May, he has undergone three.  He feels that there has been an  improvement in his functional capacity and decrease in the pain medication.  He has really only used approximately 100 10/325 hydrocodone tablets since  May.  He is requesting a refill on this medication today.  We will go ahead  and refill him 10/325 one to two p.o. daily, #120.  We will see him back in  3 months.  He has been stable on this medication.  He uses rather  infrequently.  He has displayed no aberrant behavior.           ______________________________  Brantley Stage, M.D.    DMK/MedQ  D:  02/04/2006 10:15:49  T:  02/05/2006 08:52:05  Job #:  [id number]   cc:   Gregary Signs A. Everardo All, MD  [address]  [place]  [place] [address]
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
## 2010-08-30 NOTE — Cardiovascular Report (Signed)
NAMESILVERIO, HAGAN                ACCOUNT NO.:  [id number]   MEDICAL RECORD NO.:  [id number]          PATIENT TYPE:  INP   LOCATION:  3729                         FACILITY:  [facility]   PHYSICIAN:  Everardo Beals. 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  There is no prior history of known heart disease.  Does have chronic  obstructive lung disease with continued smoking.  He is admitted to the  hospital with chest pain and positive troponin's consistent with a non-  ST-elevation infarction.  He was also found to have a nodule on chest x-  ray and a CT scan showed a spiculated lesion suggestive of cancer.   PROCEDURE:  The procedure was performed with a femoral arterial sheath  and 6 French preformed coronary catheters.  A front wall arterial  puncture was performed and Omnipaque contrast was used.  An aortogram  was performed to evaluate the patient's peripheral vascular disease.  The patient tolerated the procedure well and left the laboratory in  satisfactory condition.   RESULTS:  Left main coronary artery:  The left main coronary was  irregular, but was free of major obstruction.   Left anterior descending artery:  Left anterior descending artery gave  rise to a large diagonal branch, a septal perforator and a second  diagonal branch.  There was 90% stenosis just before the large diagonal  branch.  There was aneurysmal dilatation at the very ostium and proximal  portion of the diagonal branch.  There was 80% narrowing in the mid-to-  distal LAD.   The circumflex artery.  The circumflex art4ry is a moderate-sized vessel  gave rise to first small marginal branch, a second larger marginal  branch was 70% stenosis and the third marginal branch was totally  occluded and filled by collaterals.  There was a  posterolateral branch  which was free of major obstruction.   The right coronary was completely occluded proximally.  Distal right  coronary filled best site of the left coronary consistent with a  posterior descending and a large posterolateral branch.  There is also  some distal filling from collaterals from the right coronary artery.   The left ventricle and left hip objection showed good wall motion with  no areas of hypokinesis.  Estimated fraction was 6%.   A __________ was performed which showed patent renal arteries.  There  were irregularities in aorta and iliac's but no major obstruction.  The  aortic pressure was 136/76 with mean 98 and left neck pressure 136/10.   CONCLUSION:  Three-vessel coronary artery disease with 90% stenosis in  the proximal LAD and 80% narrowing in the mid LAD, 79 units second  marginal and total occlusion of third marginal branch of circumflex  artery and total occlusion of the right groin was normal LV function.   RECOMMENDATIONS:  I think the patient will need bypass surgery.  However, he has obstructive pulmonary disease. This may  need to be  evaluated first and he has a pulmonary nodule suspicious of cancer.  I  will need to decide overall approach.  Will plan to get a consultation  from CTS in critical care medicine.      Bruce Elvera Lennox Juanda Chance, MD, [facility]  Electronically Signed     BRB/MEDQ  D:  08/10/2006  T:  08/10/2006  Job:  [id number]   cc:   Gregary Signs A. Everardo All, MD  Everardo Beals Juanda Chance, MD, [facility]

## 2010-08-30 NOTE — Op Note (Signed)
NAMEKHYSON, SEBESTA NO.:  [id number]   MEDICAL RECORD NO.:  [id number]          PATIENT TYPE:  OUT   LOCATION:  XRAY                         FACILITY:  [facility]   PHYSICIAN:  Evelene Croon, M.D.     DATE OF BIRTH:  1940/06/22   DATE OF PROCEDURE:  08/13/2006  DATE OF DISCHARGE:                               OPERATIVE REPORT   PREOPERATIVE DIAGNOSIS:  Severe three vessel coronary artery disease.   POSTOPERATIVE DIAGNOSIS:  Severe three vessel coronary artery disease.   OPERATIVE PROCEDURE:  Median sternotomy, extracorporeal circulation,  coronary bypass graft surgery x7 using a left internal mammary artery  graft to the left anterior descending coronary artery, with a sequential  saphenous vein graft to the first and second diagonal arteries, a  sequential saphenous vein graft to the second and third obtuse marginal  branches of the left circumflex coronary, and a sequential saphenous  vein graft to the posterior descending and posterolateral branches of  the right coronary.  Endoscopic vein harvesting from the right leg.   ATTENDING SURGEON:  Evelene Croon, M.D.   ASSISTANT:  Coral Ceo, P.A.-C.   ANESTHESIA:  General endotracheal.   CLINICAL HISTORY:  This patient is a 70 year old gentleman who was  admitted with a non-ST segment elevation MI with mildly elevated  troponins.  A chest x-ray showed a right upper lobe lung mass.  A CT  scan of the chest was performed which showed a 2.2 by 2.6 cm spiculated  right upper lobe lung mass located posteriorly.  It also showed some  calcified mediastinal lymph nodes as well as a significant sized right  hilar lymph node mass.  The patient subsequently underwent cardiac  catheterization which showed severe three vessel disease.  The LAD had a  90% proximal stenosis.  This was at the takeoff of a diagonal branch  that had two sub-branches, both of which were diseased proximally.  The  LAD also had about 80% stenosis  after the diagonal branch.  The left  circumflex gave off a small first marginal and then a large second  marginal that had about 70% stenosis.  There was third marginal that was  occluded and filled by collaterals.  The right coronary was occluded and  filled by collaterals from the right and left.  Left ventricular  function was normal.  There was no mitral regurgitation and no aortic  stenosis.   After review of the angiogram and examination of the patient, it was  felt that coronary artery bypass graft surgery was the best treatment.  We did a PET scan which showed increased uptake in the right upper lobe  lung mass consistent with carcinoma.  There was no increased uptake in  the mediastinum and very slight uptake in the right hilum which was  nonspecific.  There were no other areas of increased uptake throughout  the body.  A CT scan of the brain was negative.  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  The plan was to allow him  to recover from that surgery and then plan to perform right thoracotomy  and right upper lobectomy with mediastinal lymph node dissection.   I discussed the operative procedure and plans with the patient and his  wife and daughter.  We discussed alternatives, benefits, and risks  including but not limited to bleeding, blood transfusion, infection,  stroke, myocardial infarction, graft failure, and death.  We also  discussed the possibility that he may, indeed, have metastatic lung  cancer at this time that we do not know about or he may develop  metastatic lung cancer in the interval between this surgery and the lung  resection.  They understood all this and agreed to proceed.   OPERATIVE PROCEDURE:  The patient was taken to the operating room and  placed on the table in a supine position.   After the induction of  general endotracheal anesthesia, a Foley catheter was placed in the  bladder using sterile technique.  Then, the chest, abdomen and both  lower extremities were prepped and draped in the usual sterile manner.  The chest was entered through a median sternotomy incision and the  pericardium opened in the midline.  Examination of the heart showed good  ventricular contractility.  The ascending aorta had no palpable plaques  in it.   Then, the left internal mammary artery was harvested from the chest wall  as a pedicle graft.  This was a medium caliber vessel with excellent  blood pressure through it.  At the same time, a segment of greater  saphenous vein was harvested from the right leg using endoscopic vein  harvest technique.  This vein was of medium size and good quality.   Then, the patient was heparinized and when an adequate activated  clotting time was achieved, the distal ascending aorta was cannulated  using a 20 French aortic cannula for arterial inflow.  Venous outflow  was achieved using a two stage venous cannula for the right atrial  appendage.  Antegrade cardioplegia and vent cannula was inserted in the  aortic root.  The patient was placed on cardiopulmonary bypass and the  distal coronaries identified.  The LAD was a large graftable vessel.  The diagonal branch bifurcated proximally.  There was heavy proximal  calcified disease in the diagonal branch, but both sub-branches appeared  small but graftable.  The first marginal was tiny.  The second marginal  was a large graftable vessel.  It was diffusely diseased.  The third  marginal was a smaller vessel but graftable and was also diffusely  diseased.  The right coronary gave off a small posterior descending  branch and a larger posterolateral branch, both which were suitable for  grafting.   Then, the aorta was crossclamped and 1000 mL of cold blood antegrade cardioplegia was administered in the  aortic root with quick arrest the  heart.  Systemic hypothermia to 28 degrees centigrade and topical heart.  Topical hypothermic iced saline was used.  A temperature probe was  placed in the septum and an insulating pad in the pericardium.   The first distal anastomosis was performed to the posterior descending  coronary artery.  The internal diameter of this vessel was about 1.5 mm.  The conduit used was a segment of greater saphenous vein.  The  anastomosis was performed in a sequential side-to-side manner using  continuous 7-0 Prolene suture.  Flow was  noted through the graft and was  excellent.   The second distal anastomosis was performed to the posterolateral  branch.  The internal diameter was about 1.75 mm.  The conduit used was  the same segment of greater saphenous vein.  The anastomosis was  performed in a sequential end-to-side manner using continuous 7-0  Prolene suture.  Flow was noted through the graft and was excellent.  Then, another dose of cardioplegia was given down the vein graft and the  aortic root.   The third distal anastomosis was performed to the second marginal  branch.  The internal diameter was about 1.75 mm.  The conduit used was  a second segment of greater saphenous vein.  The anastomosis was  performed in a sequential side-to-side manner using continuous 7-0  Prolene suture.  Flow was noted through the graft and was excellent.   The fourth distal anastomosis was performed to the third marginal  branch.  The internal diameter was about 1.5 mm.  The conduit used was  the same segment of greater saphenous vein.  The anastomosis was  performed in a sequential end-to-side manner using a continuous 8-0  Prolene suture.  Flow was noted through the graft and was excellent.  Then, another dose of cardioplegia was given down the vein grafts and  the aortic root.   The fifth distal anastomosis was then performed to the first diagonal  sub-branch.  The  internal diameter of this vessel was about 1.6 mm.  The  conduit used was a third segment of greater saphenous vein.  The  anastomosis was performed in a sequential side-to-side manner using  continuous 7-0 Prolene suture.  Flow was noted through the graft and was  excellent.   The sixth distal anastomosis was performed to the second diagonal sub-  branch.  The internal diameter of this vessel was about 1.5 mm.  The  conduit used was the same segment of greater saphenous vein.  The  anastomosis was performed in a sequential end-to-side manner using  continuous 7-0 Prolene suture.  Flow was noted through the graft and was  excellent.   The seventh distal anastomosis was then performed to the mid portion of  the left anterior descending coronary.  The internal diameter was about  2 mm.  The conduit used was the left internal mammary graft and this was  brought through an opening in the left pericardium anterior to the phrenic nerve.  It was anastomosed to the LAD in an end-to-side manner  using continuous 8-0 Prolene suture.  The pedicle was sutured to the  epicardium using 6-0 Prolene sutures.  The patient was rewarmed to 37  degrees centigrade.  Another dose of cardioplegia was given.   With the crossclamp in place, the three proximal vein graft anastomoses  were performed to the aortic root in an end-to-side manner using  continuous 6-0 Prolene suture.  The clamp was removed from the mammary  pedicle.  There was rapid warming of the ventricular septum and return  of spontaneous ventricular fibrillation.  The crossclamp removed with a  time of 109 minutes and there was spontaneous return of sinus rhythm.  The proximal and distal anastomoses appeared hemostatic and the lie of  the grafts satisfactory.  Graft markers were placed around the proximal  anastomoses.  Two temporary right ventricular and right atrial pacing  wires were placed and brought through the skin.   When the patient  had been rewarmed to 37 degrees centigrade, he was  weaned from cardiopulmonary bypass on no inotropic agents.  Total bypass  time was 133 minutes.  Cardiac function appeared excellent.  Cardiac  outputs were 8 liters per minute.  Protamine was given. The venous and  aortic cannulae were removed without difficulty.  Hemostasis was  achieved.  Three chest tubes were placed with two in the posterior  pericardium, one in the left pleural space, and one in the anterior  mediastinum.  The pericardium was loosely reapproximated over the heart.  The sternum was closed with #6 stainless steel wires.  The fascia was  closed with continuous #1 Vicryl suture.  The subcutaneous tissue was  closed with continuous 2-0 Vicryl and the skin with 3-0 Vicryl  subcuticular closure.  The lower extremity vein harvest site was closed  in layers in a similar manner.  The sponge, needle, and instrument  counts were correct according to the scrub nurse.  Dry sterile dressings  were applied over the incisions and around the chest tubes which were  hooked to Pleur-Evac suction.  The patient remained hemodynamically  stable and was transferred to the SICU in guarded but stable condition.      Evelene Croon, M.D.  Electronically Signed     BB/MEDQ  D:  08/13/2006  T:  08/13/2006  Job:  [id number]   cc:   [facility]

## 2010-08-30 NOTE — Procedures (Signed)
[name], [name] NO.:  [id number]   MEDICAL RECORD NO.:  [id number]          PATIENT TYPE:  REC   LOCATION:  [facility]PC                          FACILITY:  [facility]   PHYSICIAN:  Erick Colace, M.D.DATE OF BIRTH:  10-13-1940   DATE OF PROCEDURE:  09/26/2005  DATE OF DISCHARGE:                                 OPERATIVE REPORT   PROCEDURE:  Left S1 transforaminal epidural steroid injection under  fluoroscopic guidance.   INDICATIONS FOR PROCEDURE:  Left lumbosacral radiculopathy, previous good  effect lasting approximately three weeks on the identical procedure  performed Aug 28, 2005, now has been wearing off, starting back on his p.o.  medications again.   Informed consent was obtained after describing risks and benefits of the  procedure to the patient.  These include bleeding, bruising, infection, loss  of bowel and bladder function, temporary or permanent paralysis.  Patient  elected to proceed and was given written consent.  Patient placed prone on  fluoroscopy table, Betadine prep and sterile drape.  A 25-gauge inch and a  half inch needle was used to anesthetize the skin and subcutaneous tissue,  1% lidocaine x4 mL.  Then a 22-gauge 3-1/2 inch spinal needle was inserted  under fluoroscopic guidance to left S1 foramen.  After needle positioning  under AP lateral imaging, Omnipaque 180 x0.5 mL demonstrated no  intravascular uptake and 0.5 mL of Depo-Medrol lidocaine solution was  injected.  Patient tolerated the procedure well.  Post injection  instructions given.      Erick Colace, M.D.  Electronically Signed     AEK/MEDQ  D:  09/26/2005 08:54:33  T:  09/26/2005 09:58:36  Job:  [id number]   cc:   Brantley Stage, M.D.  Fax: [phone]

## 2010-08-30 NOTE — Assessment & Plan Note (Signed)
[name] is a 70 year old gentleman who has been followed in our pain  and rehabilitative clinic for chronic low back pain and intermittent  left lower extremity pain.   He has undergone 3 epidurals over the last year beginning Aug 28, 2005,  September 26, 2005, and June 09, 2006.  Initially, he was getting good  relief with these epidurals, however, he feels that after the last one,  it is just not helping as much as it used to.   He is frustrated that he can only stand up and walk for about 2 minutes  at a time.  Feels his function is quite limited.  His average pain is  about 8 on a scale of 10.  Pain is typically worse with walking and  standing, improved with rest and medication.   He wants to continue playing golf, but just is unable to stand that long  to participate in that particular activity.   He reports pain is about 20% in his low back, about 80% in his leg.  Leg  pain is constant, but it does get worse with walking.   He reports he is getting a little relief with the current meds that he  is on.   He is independent with his self care.  He does admit to some depression.  Denies suicidal ideation.  Admits to occasional constipation and  shortness of breath.   No changes otherwise in past medical, social, or family history since  our last visit.   MEDICATIONS:  Prescribed by this clinic include:  1. Hydrocodone 10/325 up to 4 times a day.  2. Neurontin 300 mg up to 3 times a day.  Attempt to increase his dose      has caused him more sedation that he would like.   His blood pressure today is 155/85.  Respirations 16.  Pulse 66.  He is  98% saturated on room air.  He is a well-developed, well-nourished gentleman who appears his stated  age.  He does not appear in any distress.  He is oriented x3.  His  affect is quite alert.  He is cooperative and pleasant.  He transitions  from sit to stand independently.  Gait in the room is slightly antalgic  with slightly  favoring his left leg.   His reflexes are 2+ at the patellar tendons, slightly decreased the  Achilles tendons parable.  His motor strength is good.  No focal  weakness is appreciated.  No sensory deficits are appreciated.   IMPRESSION:  1. Left lower extremity sciatica.  2. Lumbago.  3. Worsening symptoms of stenosis with decreased ambulation capacity      between 2 and 5 minutes.   PLAN:  We refilled his hydrocodone 10/325 one p.o. q.i.d. p.r.n. leg or  back spasm, number 120.  Patient is requesting to follow up with Dr.  Danielle Dess for neurosurgical evaluation.  We will also obtain a lumbar MRI.  We will  see him back in a month.  He has been stable, and the hydrocodone has  displayed no aberrant behavior; takes it as prescribed, and is giving  him some relief at this point.  He continues to take the Neurontin as  well.           ______________________________  Brantley Stage, M.D.     DMK/MedQ  D:  06/24/2006 16:12:05  T:  06/26/2006 10:25:32  Job #:  [id number]
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
## 2010-08-30 NOTE — Assessment & Plan Note (Signed)
MEDICAL RECORD NUMBER:  [id number]   DATE OF BIRTH:  [birth year]   REASON FOR EVALUATION:  [name] is a 70 year old gentleman who is being  seen in our pain and rehabilitative clinic for chronic low back pain and  left leg pain secondary to lumbar spondylolisthesis and L5 stenosis.   He has returned to clinic today after undergoing his left S1 transforaminal  epidural steroid injection done on Aug 28, 2005.   He states that he is 50% to 60% better.  He has only used 2 pills of his  hydrocodone in the last week and he has not used any ibuprofen.  His average  pain is about a 4 on a scale of 10; it was a 9.  His activity level has  improved significantly.   He is able to walk between 10 and 15 minutes at a time.  He is able to climb  stairs.  He is driving.  He is planning to play golf again.  He is  interested in going on a vacation and he is requesting another epidural  steroid injection prior to his vacation.   There are no changes in his past medical, social or family history.  He  denies problems with depression, anxiety or suicidal ideation, bowel or  bladder -- no changes.   PHYSICAL EXAMINATION:  VITAL SIGNS: Blood pressure 125/79, pulse 78,  respirations 16, saturated 96% on room air.  GENERAL: He is a well-developed, well-nourished gentleman who appears his  stated age.  He is oriented x3.  His affect is bright and alert.  He is  cooperative and pleasant.  NEUROMUSCULAR: He transitions from sit-to-stand without any difficulty.  His  gait is entirely stable.  Tandem gait and Romberg tests are normal.  Reflexes are symmetric and intact in the lower extremities.  Motor strength  is good and straight leg raise is negative.  No abnormal tone is noted.   IMPRESSION:  1.  Lumbago.  2.  Symptoms of lumbar stenosis with diminished ambulation capacity.  3.  Anterolisthesis, L5 on S1.   PLAN:  We will set [name] up for second epidural steroid injection.  He  does not need  any pain medications at this time.  We will see him back in a  month for reevaluation.           ______________________________  [name], M.D.    DMK/MedQ  D:  09/19/2005 13:21:12  T:  09/20/2005 05:56:14  Job #:  [id number]   cc:   [name], M.D. [facility]  [address]  [place]  [place] [address]

## 2010-08-30 NOTE — Assessment & Plan Note (Signed)
Mr. Moehring is a 70 year old, extremely pleasant gentleman who is being seen in  our Pain and Rehabilitative Clinic for chronic low back pain, left leg pain,  felt to be secondary to lumbar spondylolisthesis and L5 stenosis.   He has undergone 2 epidural injections by Dr. Wynn Banker.  The last one was  on September 26, 2005.  He had a left S1 transforaminal injection.   He states he has done very well with these.  He is able to resume playing  golf and engage in some other activities that he had not been able to do  prior to the injections.   His average pain is about a 4 on a scale of 10.  He is able to sleep fairly  well.  He gets good relief, mainly with just the injections.  He has had a  prescription for some hydrocodone; however, he has only taken a few pills  over the last couple of months because the epidural has been so successful  in diminishing his leg pains.   He is able to walk less than 10 minutes at this time.  He is retired.  He  stays active around the house.  He believes he may have had a slight weight  loss.   He continues to smoke 2 packs of cigarettes a day.   MEDICATIONS:  1. Lidoderm p.r.n.  2. Ibuprofen on a p.r.n. basis as well.  He is really not taking much of      this currently.  3. Norco 10/325.  He has only taken a few pills over the last several      months.   He maintains contact with Dr. Everardo All; however, he states he does not have  any medical problems at this point.  He is on no other medications.   EXAM:  Blood pressure 144/62.  Pulse 62.  Respirations 16; 96% saturated on  room air.  He is a well-developed, well-nourished, gentleman who appears his stated  age.  He does not appear in any distress. His affect is bright.  He is  alert.  He is cooperative and pleasant.  He transitions from sit to stand without any difficulty.  His gait is  normal.  Balance is good.  He does have a somewhat mild to moderately  kyphotic mid thoracic region.  He states  he is bent over because of years  of this leg pain.  He has intact lower extremity reflexes.  There are no sensory deficits  appreciated with light touch today and motor strength is excellent.  Normal  bulk is appreciated.  No abnormal tone is appreciated.   IMPRESSION:  1. Lumbago.  2. Symptoms of lumbar stenosis with diminished ambulation capacity.  3. Anterior listhesis, L5 on S1.  4. Nicotine addiction.  5. Thoracic kyphosis.   PLAN:  We would like to get him set up for a third epidural injection.  He  has had quite a bit of benefit with the last couple which have lasted  several months for him.  He is quite pleased with the results of the  previous epidural injections.  He has had a recurrence of his leg pain on  the left, not to the degree prior to epidural injections, but he feels that  it is starting to impact his function a bit.   May also consider a trial of Neurontin with him at the next visit.  I did  mention to him that he needs to cut down and stop  smoking, eventually would  like to also obtain some thoracic radiographs to evaluate his kyphosis a  little further.  He may consider workup for osteoporosis.  He is not a milk  drinker and is not sure he is getting much calcium in at all.   We will see him back in a month.           ______________________________  Brantley Stage, M.D.     DMK/MedQ  D:  01/07/2006 09:33:48  T:  01/08/2006 19:43:00  Job #:  [id number]

## 2010-08-30 NOTE — Assessment & Plan Note (Signed)
[name] is a very pleasant 45o gentleman who is being seen in our pain and  rehabilitative clinic for chronic low back pain, left leg pain secondary to  lumbar spondylolisthesis and L5 stenosis.   He underwent his second epidural injection on September 26, 2005, by Dr.  Wynn Banker, M.D.   He states his average pain is now about a 2 on a scale of 10.   Pain interferes very little with activity, relationship with others, or  __________at this point.  He is quite pleased with reduction in his pain  scores.   He is back to golfing.  He states he has been going to yard sales and going  to the store such as [employer] with his wife again.   He is able to climb stairs; he is driving.  He is independent with all of  his self care.   No change in his past medical, social, or family history since his last  visit.   PHYSICAL EXAMINATION:  VITAL SIGNS:  146/89, pulse 65, respirations 16, 99%  saturation on room air.  GENERAL:  He is a well-developed, well-nourished gentleman who appears his  stated age.  He is oriented x3.  His affect is bright, alert.  He is  cooperative and pleasant.   He transitions to sit to stand without difficulty with his gait.  His gait  is normal in the room.  Minor limitations and lumbar range of motion  especially with forward flexion and extension are noted.  His reflexes are,  however, symmetric and intact at the patellar tendon, slightly decreased at  the Achilles tendon.  Motor strength, however, is good throughout both lower  extremities.   IMPRESSION:  1.  Lumbago.  2.  Symptoms of lumbar stenosis with diminished ambulation capacity.  3.  Anterior listhesis L5-S1.   PLAN:  The patient has done very well status post 2 epidural injections.  His activity level is better than it has been in many years he states.   He is currently on no medications.  He is requesting no pain medications at  this time.  He has returned back to golfing as well.   We will set him  up for recheck in 2 months.  He will call if he needs this  sooner.           ______________________________  [name], M.D.     DMK/MedQ  D:  10/17/2005 13:23:28  T:  10/17/2005 14:52:02  Job #:  [id number]   cc:   Gregary Signs A. Everardo All, M.D. [facility]  [address]  [place]  [place] [address]

## 2010-08-30 NOTE — Assessment & Plan Note (Signed)
[name] is a pleasant, 70 year old, married gentleman who is being seen in  our Pain and Rehabilitative clinic for chronic low back pain and  predominantly left leg pain.   He was last seen April 13. At that time, he had opted for medical management  of his pain. We had restarted nonsteroidal antiinflammatory medications for  him and he takes 600 mg of ibuprofen on a p.r.n. basis up to twice a day and  he was also on Norco 7.5/325 1-2 p.o. daily.   He is back in today and reports that he was getting a little relief with the  pain medications. He does okay when he is not active however, yesterday he  was involved doing outside work spreading mulch around and during the  activity, he had quite a bit of leg pain which interfered with his activity.   Today he had not taken any medications prior to coming into clinic. He  states his pain in the left leg after coming out from the parking lot is  about a 9 on a scale of 10. He reports good sleep, does not feel we need any  interventions regarding his sleep. He gets however between a little and fair  relief with the medications during his activities. He also has some back  pain which is to a lesser degree compared to the leg pain. The leg pain is  predominantly located below the knee into the anterior tibial area.   He is retired but stays very active doing things at home. He is independent  with his self care. He does go shopping with his wife but finds he needs to  lean over the shopping cart to participate in this particular activity.   No changes in review of systems. No change in past medical, social or family  history.   He continues to smoke 2 packs of cigarettes a day. He has been doing this  for about 45 years. I counseled him against this particular behavior. He  states he understands but does not wish to quit smoking at this time.   PHYSICAL EXAMINATION:  Blood pressure 150/78, pulse 72, respirations 17, 97%  saturated on room  air.   A thin adult gentleman who appears his stated age. He is oriented x3. Affect  is bright, alert, cooperative and pleasant.   He transitions from sit to stand, he appears a bit stiff. Standing in the  room, he has a forward flexed lumbar spine, he stands with his trunk  slightly forward on his pelvis. He has limitations in lumbar range in all  planes.   He tends to stand and weightbearing with less weight on the left leg. Gait  is slightly asymmetric. He however does have good heel-toe mechanics and his  balance is good.   Romberg test is negative. Tandem gait is within normal limits.   Reflexes are intact to the patella tendons, 2+ and symmetric, right Achilles  tendon is 2+, left Achilles tendon is absent. He has good strength however,  no focal weakness is noted in the lower extremities, hip flexors, knee  extensors, plantar flexors, EHL.   Slight numbness is also noted over the dorsum of the left foot.   IMPRESSION:  1.  Lumbago.  2.  Symptoms of lumbar stenosis with diminished ambulation capacity.  3.  Anterior listhesis L5 and S1.   Again reviewed medications and conservative options. [name] states he  would like to pursue epidural injections and we will get him  set up for  injections in the next couple of weeks. Will refill his Norco 10/325 1-2  p.o. b.i.d. p.r.n. back pain #100. Again cautioned against using this  medication with equipment or driving; however, if he is up and active may  consider premedicating prior to activity such as putting mulch down. Again  counseled against smoking. He has received a TENS trial, it was not to be of  significant benefit. [name] is not interested in being sedated at all  during the day. We have discussed the possibility of adding Lyrica or  Neurontin. He is not interested in these medications currently. Would like  to pursue epidural steroid injections. Will see him back then in 1 month.            ______________________________  Brantley Stage, M.D.     DMK/MedQ  D:  08/22/2005 10:21:00  T:  08/23/2005 08:38:39  Job #:  [id number]

## 2010-08-30 NOTE — Procedures (Signed)
NAMEABDULHAMID, OLGIN NO.:  [id number]   MEDICAL RECORD NO.:  [id number]          PATIENT TYPE:  REC   LOCATION:  [facility]                          FACILITY:  [facility]   PHYSICIAN:  Erick Colace, M.D.DATE OF BIRTH:  [birth year]   DATE OF PROCEDURE:  DATE OF DISCHARGE:                               OPERATIVE REPORT   PROCEDURE:  Left S1 transforaminal epidural steroid injection under  fluoroscopic guidance.   INDICATIONS:  Left S1 radiculitis previously improved after injection  October 11.  He is off his ibuprofen now and not taking any  anticoagulants or antibiotics.  He had a previous injection performed  September 26, 2005, as well.   Informed consent was obtained after describing the risks and benefits of  the procedure with the patient.  These include bleeding, bruising,  infection, loss of bladder and bowel function, temporary or permanent  paralysis, he elects to proceed.  The patient was placed prone on the  fluoroscopy table.  Betadine prep, sterile drape.  A 25 gauge 1 1/2 inch  needle was used to anesthetize the skin and subcu tissue with 1%  lidocaine, 2 mL.  Then, a 22 gauge 3 1/2 inch spinal needle was inserted  under fluoroscopic guidance in the S1 foramen. AP and lateral imaging  utilized. Omnipaque 180 x 0.5 mL demonstrated no intravascular uptake.  Then, 1 mL of 40 mg per mL of Depo-Medrol plus 2 mL of 1% MPF lidocaine  were injected.  The patient tolerated the procedure well.  Pre and post  injection vitals stable. Pre-injection pain level 8/10, post injection  pain level 6/10. He will follow up Dr. Pamelia Hoit and determine whether  repeat injection is needed at that time.      Erick Colace, M.D.  Electronically Signed     AEK/MEDQ  D:  06/09/2006 18:03:01  T:  06/09/2006 21:36:15  Job:  [id number]   cc:   Brantley Stage, M.D.  Fax: [phone]

## 2010-08-30 NOTE — Procedures (Signed)
NAMEPATRIK, [name] NO.:  [id number]   MEDICAL RECORD NO.:  [id number]          PATIENT TYPE:  REC   LOCATION:  [facility]                          FACILITY:  [facility]   PHYSICIAN:  Erick Colace, M.D.DATE OF BIRTH:  Oct 10, 1940   DATE OF PROCEDURE:  08/28/2005  DATE OF DISCHARGE:                                 OPERATIVE REPORT   PROCEDURE PERFORMED:  Left S1 transforaminal epidural steroid injection  under fluoroscopic guidance.   INDICATIONS FOR PROCEDURE:  L5 on S1 spondylolisthesis with L5 foraminal  stenosis.  He has pain predominantly in the left lower extremity down to his  foot.   Informed consent was obtained after describing risks and benefits of the  procedure to the patient.  These include bleeding, bruising, infection, loss  of bowel and bladder function, temporary or permanent paralysis.  I utilized  a spine model to help explain the procedure.  The patient elected to  proceed, giving written consent.   Patient placed prone on fluoroscopy table.  Betadine prep, sterile drape, 25  gauge inch and a half needle was used to anesthetize skin and subcutaneous  tissues with 1% lidocaine x 2 mL.  Then a 22 gauge 3-1/2 inch spinal needle  with stilette was inserted under fluoroscopic guidance into the left S1  foramen, both AP and lateral images were utilized.  Omnipaque 1A view showed  good foraminal contrast uptake with epidural as well as nerve root spread.  Then infusion containing 1 mL of 40 mg per mL Depo-Medrol and 2 mL of 1%  methylparaben-free lidocaine were injected.  The patient tolerated the  procedure well.  Post injection instructions given.  He will follow up with  Dr. Pamelia Hoit in two to three weeks and she will set him up with another  injection if needed.  Preinjection pain level 9/10.  For postinjection pain  level plus vitals, see post procedure log.      Erick Colace, M.D.  Electronically Signed     AEK/MEDQ  D:   08/28/2005 13:04:44  T:  08/29/2005 07:21:12  Job:  [id number]

## 2010-08-30 NOTE — Assessment & Plan Note (Signed)
[name] is a 70 year old gentleman who has been followed in [facility] for chronic low back and left lower extremity  pain.   He has undergone 3 epidural steroid injections over the last year  beginning Aug 28, 2005, September 26, 2005, June 09, 2006.   He was initially getting good relief with these epidurals; however,  after the last one he feels that it is just not helping as much as it  used to.   He is frustrated.  He can walk only about 5 minutes at a time.  He is  interested in playing some golf again and he is really not able to do  that at this point.  He had been able to earlier last summer.   Most of his pain is located in the left lower leg below the knee.  He  describes it as an 8 or a 9 on a scale of 10.  Without pain when he  sits; however, standing or walking brings the pain up to a level of an 8  or a 9.  His low back does bother him somewhat in the left buttock  region, which is about a 3 or a 4 on a scale of 10.   He gets fair relief with the current meds that he is prescribed from  this clinic which includes ibuprofen 600 mg twice a day, Neurontin 300  mg three times a day, and hydrocodone 10/325 up to four times a day.   He is independent with his self-care.  He is otherwise a high  functioning individual.  He does get some constipation which he treats  with some laxatives a couple of times a week.  He has no problems with  over-sedation from the medications.  He denies anxiety or suicidal  ideation.  He does admit to some mild depression, however; he attributes  this to his decreased function.   He is followed by Dr. Romero Belling.  He is planning to have a physical  exam done within the next couple of months.  It has been over a year  since he has had a checkup.   He had some antibiotics recently for a back cyst that was removed;  otherwise, he has been relatively.   He had been smoking up to 2-1/2 packs a day, he is down to less  than a  pack a day at this point.   EXAM:  His blood pressure is 140/88, pulse 61, respirations 16, 96%  saturated on room air.  He is a thin adult gentleman who does not appear  in any distress.  He is oriented x3.  His affect is bright, alert and  cooperative.  His speech is clear.  He follows commands without any  difficulty.   He transitions from sit to stand independently without pain behaviors  noted.  His gait in the room is stable and normal.  Tandem gait and  Romberg test are performed adequately.   He has some limitations in lumbar motion in all planes.  His reflexes  are 2+ at the patellar tendons, 1+ at the right Achilles tendon, 0 at  the left Achilles tendon.   His strength in the lower extremities is quite good at the hip flexors,  knee extensors, dorsiflexors and plantar flexors.  He does seem to have  a slight bit of weakness in the left EHL compared to the right.   He has some sensitivity with light touch over the  left anterior tibial  region and numbness with pinprick over the dorsum of the left foot.   There is no abnormal tone noted, toes are downgoing with respect to  Babinski exam.   MRI scan:  MRI scan was done July 01, 2006, read by Dr. Holley Dexter.   PERTINENT FINDINGS:  A severe bilateral neuroforaminal narrowing at L5  S1 status post right laminectomy at L5 S1, bilateral pars  interarticularis defect L5, and 0.9 cm of anterior listhesis of L5 on  S1.   IMPRESSION:  1. Symptoms of lumbar stenosis with diminished ambulatory capacity      less than 5 minutes with severe bilateral neuroforaminal narrowing      per lumbar MRI July 01, 2006.  2. Lumbago.   PLAN:  Will refill Mr. Zalyn Amend 16/109 one p.o. q.i.d. p.r.n. leg or  back pain, #120.  Will refill his Neurontin and increase it slightly,  Neurontin 300 mg one p.o. up to five times a day, #150, three refills.   Mr. Conover is not interested in further epidural injections at this  point,  he did not find the last one to be of much help to him.   He would like to seek a surgical consultation at this point to determine  what his options are.  He has requested to follow up with Dr. Danielle Dess and  we will go ahead and send copies of our last notes as well as a copy of  the recent MRI to his office.  I will see Mr. Yuan back in a month.           ______________________________  Brantley Stage, M.D.     DMK/MedQ  D:  07/22/2006 14:54:53  T:  07/22/2006 16:13:58  Job #:  [id number]

## 2010-08-30 NOTE — Assessment & Plan Note (Signed)
[name] is back in for a recheck and refill of his medications.  He  states that he does not believe the epidural gave him any lasting  relief.  He states his average pain is about a 6 or 7 on a scale of 10.  He continues to have pain in the left tibial region as well as the left  low back.  He states his pain is described as burning and rather  constant in natured, interferes moderately with his activity.  He  continues to play golf twice a week, however, his ambulation is limited  to about five minutes.  The pain is worse with walking and standing and  improves with rest and medication.   No changes in regarding problems controlling bowel or bladder.  Denies  depression and anxiety.  Denies suicidal ideation.  Denies fevers,  chills, weight gain, weight loss.  Denies night sweats.  Denies any  problems with shortness of breath.  He does continue to smoke two packs  of cigarettes a day.  He reports no new medical problems.  I last saw  him February 04, 2006.   MEDICATIONS:  Medications prescribed by this clinic include the  following:  1. Lidoderm on a p.r.n. basis.  2. Ibuprofen 600 mg one p.o. b.i.d.  3. Norco 10/325 one to two p.o. b.i.d., #120 was given last February 04, 2006.   EXAMINATION:  VITAL SIGNS:  Blood pressure 153/68, pulse 62,  respirations 16, 96% saturate on room air.  GENERAL:  He is well developed, well nourished gentleman who appears his  stated age.  He is appropriately dressed.  He is oriented times three.  His speech is clear.  His affect is bright, alert, cooperative, and  pleasant.  He follows commands without any difficulty.  TRANSITION:  From sit to stand without difficulty.  Gait in the room is  nonantalgic.  He has limitations in lumbar motion in all planes but does  not complain of any low back pain today, per se.  He states he just took  some pain medication and actually feels pretty good.  His reflexes in  the lower extremities are in the 2+  range patella tendons and Achilles  tendons.  Motor strength is good.  Measurements are 15 cm below the  inferior patellar pole was 36.5 cm and 37.0 cm on the left.  He seems to  have some exquisite tenderness with palpation over the left tibia today  and he does have decreased sensation along the medial lower leg.   IMPRESSION:  1. Extreme sensitivity of his left tibia.  2. Lumbago.  3. Symptoms of lumbar stenosis with decreased ambulation capacity.  4. Sensory deficit over the left lower extremity.  5. Nicotine addiction.  6. Thoracokyphosis.   PLAN:  I refilled the following medications for [name]:  We will start  him on Neurontin one p.o. q. h.s. times three days then one p.o. t.i.d.,  #90.  He has 34, 10/325 Norco left.  He is taking approximately one a  day occasionally two a day.  This should last him almost the month.  He  does not a refill on that at this point.  He still has some ibuprofen  left as well.  He will call us if he requires refill on either of these  medications.  He has been stable on these medications.  He has been  rather infrequently and has not displayed any arrant behavior.  In light of this exquisite sensitivity to palpation of the left tibia, I  would like to get an AP lateral of the left tibia. We will let him know  if there is any problems with that, otherwise, I will see him back in  one month.          ______________________________  Brantley Stage, M.D.    DMK/MedQ  D:  04/29/2006 10:01:54  T:  04/29/2006 12:06:15  Job #:  [id number]

## 2010-11-18 ENCOUNTER — Other Ambulatory Visit: Payer: Self-pay | Admitting: Surgery

## 2010-11-18 DIAGNOSIS — C3410 Malignant neoplasm of upper lobe, unspecified bronchus or lung: Secondary | ICD-10-CM

## 2010-11-19 ENCOUNTER — Encounter: Payer: Self-pay | Admitting: Surgery

## 2010-12-03 ENCOUNTER — Encounter: Payer: Medicare Other | Admitting: Surgery

## 2010-12-11 ENCOUNTER — Other Ambulatory Visit: Payer: Self-pay | Admitting: Surgery

## 2010-12-11 DIAGNOSIS — C3410 Malignant neoplasm of upper lobe, unspecified bronchus or lung: Secondary | ICD-10-CM

## 2010-12-13 DIAGNOSIS — C3490 Malignant neoplasm of unspecified part of unspecified bronchus or lung: Secondary | ICD-10-CM

## 2010-12-17 ENCOUNTER — Ambulatory Visit
Admission: RE | Admit: 2010-12-17 | Discharge: 2010-12-17 | Disposition: A | Discharge status: Home / Self Care | Payer: Medicare Other | Source: Ambulatory Visit | Attending: Surgery | Admitting: Surgery

## 2010-12-17 ENCOUNTER — Encounter: Payer: Medicare Other | Admitting: Surgery

## 2010-12-17 DIAGNOSIS — C3410 Malignant neoplasm of upper lobe, unspecified bronchus or lung: Secondary | ICD-10-CM

## 2010-12-31 ENCOUNTER — Ambulatory Visit (INDEPENDENT_AMBULATORY_CARE_PROVIDER_SITE_OTHER): Payer: Medicare Other | Admitting: Surgery

## 2010-12-31 ENCOUNTER — Encounter: Payer: Self-pay | Admitting: Surgery

## 2010-12-31 VITALS — BP 140/90 | HR 58 | Resp 16 | Ht 70.0 in | Wt 200.0 lb

## 2010-12-31 DIAGNOSIS — C3490 Malignant neoplasm of unspecified part of unspecified bronchus or lung: Secondary | ICD-10-CM

## 2010-12-31 NOTE — Progress Notes (Signed)
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 Since I last saw him on 05/21/2010 he has had no new complaints. He has continued to abstain from smoking. His weight has been stable.   Current Outpatient Prescriptions  Medication Sig Dispense Refill  . amLODipine (NORVASC) 5 MG tablet Take 1 tablet (5 mg total) by mouth daily.  30 tablet  11  . aspirin 81 MG tablet Take 81 mg by mouth daily.        . diphenhydramine-acetaminophen (TYLENOL PM) 25-500 MG TABS Take 2 tablets by mouth at bedtime as needed.        . metoprolol tartrate (LOPRESSOR) 25 MG tablet Take 25 mg by mouth 2 (two) times daily.        . psyllium (METAMUCIL) 58.6 % powder Take 1 packet by mouth daily.        Marland Kitchen senna (SENOKOT) 8.6 MG tablet Take 1 tablet by mouth daily.        . simvastatin (ZOCOR) 40 MG tablet Take 40 mg by mouth at bedtime.           Review of Systems:  He denies any headaches or visual changes. He denies cough, sputum production, and hemoptysis. He denies any chest pain or pressure. He denies any abdominal pain, constipation. He denies any muscle or bone pain.  Physical Exam  BP 140/90  Pulse 58  Resp 16  Ht 5\' 10"  (1.778 m)  Wt 200 lb (90.719 kg)  BMI 28.70 kg/m2  SpO2 97%  He looks well. There is no cervical or supraclavicular adenopathy. Lung exam is clear. The right chest incision scar looks fine. There are no skin lesions. Cardiac exam shows a regular rate and rhythm with normal heart sounds. Abdominal exam shows active bowel sounds. His abdomen is soft, mildly obese and nontender. There are no palpable masses or organomegaly.  Diagnostic Tests:  Chest x-ray today shows clear lung fields. There are no pleural effusions. There is no sign of recurrent or metastatic cancer.   Impression:  He continues to do well, almost 4-1/2  years following lung cancer surgery. He has no sign of recurrent or metastatic cancer.   Plan:  I will plan to see him back in 6 months with a chest x-ray.

## 2011-01-27 LAB — BASIC METABOLIC PANEL
BUN: 3 — ABNORMAL LOW
CO2: 28
Calcium: 9
Glucose, Bld: 109 — ABNORMAL HIGH
Potassium: 3 — ABNORMAL LOW
Sodium: 137

## 2011-01-27 LAB — PHOSPHORUS: Phosphorus: 3.7

## 2011-01-28 LAB — URINALYSIS, ROUTINE W REFLEX MICROSCOPIC
Ketones, ur: NEGATIVE
Nitrite: NEGATIVE
Protein, ur: NEGATIVE

## 2011-01-28 LAB — BASIC METABOLIC PANEL
BUN: 9
Creatinine, Ser: 0.92
GFR calc non Af Amer: >60
Potassium: 3.8

## 2011-01-28 LAB — APTT: aPTT: 30

## 2011-01-28 LAB — CBC
Platelets: 373
WBC: 7.6

## 2011-01-28 LAB — PROTIME-INR: Prothrombin Time: 14.6

## 2011-01-29 LAB — TYPE AND SCREEN
ABO/RH(D): O NEG
Antibody Screen: NEGATIVE

## 2011-01-29 LAB — CBC
HCT: 29.5 — ABNORMAL LOW
Hemoglobin: 11.8 — ABNORMAL LOW
Hemoglobin: 9.7 — ABNORMAL LOW
Hemoglobin: 9.9 — ABNORMAL LOW
MCHC: 32.7
MCHC: 32.8
MCHC: 32.9
MCHC: 32.9
MCV: 90.5
Platelets: 247
RBC: 3.26 — ABNORMAL LOW
RBC: 3.31 — ABNORMAL LOW
RDW: 15.9 — ABNORMAL HIGH
RDW: 16.5 — ABNORMAL HIGH
WBC: 10.8 — ABNORMAL HIGH
WBC: 13.6 — ABNORMAL HIGH

## 2011-01-29 LAB — COMPREHENSIVE METABOLIC PANEL
ALT: 51
Calcium: 8.9
Glucose, Bld: 121 — ABNORMAL HIGH
Sodium: 135
Total Protein: 6.9

## 2011-01-29 LAB — POCT I-STAT 3, ART BLOOD GAS (G3+)
O2 Saturation: 97
pCO2 arterial: 39.2
pH, Arterial: 7.406

## 2011-01-29 LAB — URINALYSIS, ROUTINE W REFLEX MICROSCOPIC
Bilirubin Urine: NEGATIVE
Ketones, ur: NEGATIVE
Nitrite: NEGATIVE
Protein, ur: NEGATIVE
Urobilinogen, UA: 0.2

## 2011-01-29 LAB — BLOOD GAS, ARTERIAL
Acid-base deficit: 1.2
Drawn by: 274481
O2 Saturation: 97.1

## 2011-01-29 LAB — BASIC METABOLIC PANEL
BUN: 11
CO2: 26
CO2: 27
Calcium: 7.9 — ABNORMAL LOW
Calcium: 8.1 — ABNORMAL LOW
Chloride: 98
Creatinine, Ser: 0.67
GFR calc Af Amer: >60
GFR calc non Af Amer: >60
Glucose, Bld: 122 — ABNORMAL HIGH
Sodium: 130 — ABNORMAL LOW

## 2011-01-29 LAB — PROTIME-INR
INR: 1
Prothrombin Time: 13.6

## 2011-01-29 LAB — APTT: aPTT: 30

## 2011-06-26 ENCOUNTER — Other Ambulatory Visit: Payer: Self-pay | Admitting: Surgery

## 2011-06-26 DIAGNOSIS — C3490 Malignant neoplasm of unspecified part of unspecified bronchus or lung: Secondary | ICD-10-CM

## 2011-07-01 ENCOUNTER — Ambulatory Visit: Payer: Medicare Other | Admitting: Surgery

## 2011-07-16 ENCOUNTER — Other Ambulatory Visit: Payer: Self-pay | Admitting: Surgery

## 2011-07-16 DIAGNOSIS — C3490 Malignant neoplasm of unspecified part of unspecified bronchus or lung: Secondary | ICD-10-CM

## 2011-07-22 ENCOUNTER — Encounter: Payer: Self-pay | Admitting: Surgery

## 2011-07-22 ENCOUNTER — Ambulatory Visit
Admission: RE | Admit: 2011-07-22 | Discharge: 2011-07-22 | Disposition: A | Discharge status: Home / Self Care | Payer: Medicare Other | Source: Ambulatory Visit | Attending: Surgery | Admitting: Surgery

## 2011-07-22 ENCOUNTER — Ambulatory Visit (INDEPENDENT_AMBULATORY_CARE_PROVIDER_SITE_OTHER): Payer: Medicare Other | Admitting: Surgery

## 2011-07-22 VITALS — BP 140/80 | HR 59 | Resp 20 | Ht 70.0 in | Wt 205.0 lb

## 2011-07-22 DIAGNOSIS — Z85118 Personal history of other malignant neoplasm of bronchus and lung: Secondary | ICD-10-CM

## 2011-07-22 DIAGNOSIS — C3490 Malignant neoplasm of unspecified part of unspecified bronchus or lung: Secondary | ICD-10-CM

## 2011-07-22 DIAGNOSIS — Z9889 Other specified postprocedural states: Secondary | ICD-10-CM

## 2011-07-22 DIAGNOSIS — Z902 Acquired absence of lung [part of]: Secondary | ICD-10-CM

## 2011-07-25 ENCOUNTER — Encounter: Payer: Self-pay | Admitting: Surgery

## 2011-07-25 NOTE — Progress Notes (Signed)
                   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 Since I last saw him he has had no new complaints. He has continued to abstain from smoking. His weight has been stable.   Current Outpatient Prescriptions  Medication Sig Dispense Refill  . amLODipine (NORVASC) 5 MG tablet Take 1 tablet (5 mg total) by mouth daily.  30 tablet  11  . aspirin 81 MG tablet Take 81 mg by mouth daily.        . diphenhydramine-acetaminophen (TYLENOL PM) 25-500 MG TABS Take 2 tablets by mouth at bedtime as needed.        . metoprolol tartrate (LOPRESSOR) 25 MG tablet Take 25 mg by mouth 2 (two) times daily.        . psyllium (METAMUCIL) 58.6 % powder Take 1 packet by mouth daily.        Marland Kitchen senna (SENOKOT) 8.6 MG tablet Take 1 tablet by mouth daily.        . simvastatin (ZOCOR) 40 MG tablet Take 40 mg by mouth at bedtime.           Physical Exam:  He looks well. There is no cervical or supraclavicular adenopathy. Lung exam is clear. The right chest incision scar looks fine. There are no skin lesions. Cardiac exam shows a regular rate and rhythm with normal heart sounds. Abdominal exam shows active bowel sounds. His abdomen is soft, mildly obese and nontender. There are no palpable masses or organomegaly  Review of Systems:  He denies any headaches or visual changes. He denies cough, sputum production, and hemoptysis. He denies any chest pain or pressure. He denies any abdominal pain, constipation. He denies any muscle or bone pain.   CHEST - 2 VIEW   Comparison: Chest x-ray of 12/17/2010 and CT chest of 04/25/2008   Findings: Scarring at the right lung base appears stable with some volume loss on the right. Right apical pleuroparenchymal  scarring appears stable.  The left lung is clear and somewhat hyperaerated. A probable granuloma remains at the left lung base.  Mediastinal contours are stable.  The heart is within normal limits in size. There may be a small hiatal hernia present.  Median sternotomy sutures are noted after CABG.    Diagnostic Tests:  CHEST - 2 VIEW   Comparison: Chest x-ray of 12/17/2010 and CT chest of 04/25/2008   Findings: Scarring at the right lung base appears stable with some volume loss on the right. Right apical pleuroparenchymal scarring appears stable.  The left lung is clear and somewhat hyperaerated. A probable granuloma remains at the left lung base.  Mediastinal contours are stable.  The heart is within normal limits in size. There may be a small hiatal hernia present.  Median sternotomy sutures are noted after CABG.     Impression:  He continues to do well following right upper lobectomy for early stage lung cancer in June of 2008.  Plan:  I plan to see him back in one year for follow-up with a  chest x-ray.

## 2011-08-03 ENCOUNTER — Other Ambulatory Visit: Payer: Self-pay | Admitting: Cardiology

## 2011-08-08 ENCOUNTER — Other Ambulatory Visit: Payer: Self-pay

## 2011-08-08 ENCOUNTER — Other Ambulatory Visit (INDEPENDENT_AMBULATORY_CARE_PROVIDER_SITE_OTHER): Payer: Medicare Other

## 2011-08-08 DIAGNOSIS — I2510 Atherosclerotic heart disease of native coronary artery without angina pectoris: Secondary | ICD-10-CM

## 2011-08-08 LAB — BASIC METABOLIC PANEL
BUN: 13 mg/dL (ref 6–23)
CO2: 27 mEq/L (ref 19–32)
Glucose, Bld: 104 mg/dL — ABNORMAL HIGH (ref 70–99)
Sodium: 142 mEq/L (ref 135–145)

## 2011-08-08 LAB — CBC WITH DIFFERENTIAL/PLATELET
Basophils Relative: 0.8 % (ref 0.0–3.0)
Eosinophils Relative: 3.3 % (ref 0.0–5.0)
HCT: 40 % (ref 39.0–52.0)
Lymphs Abs: 2 10*3/uL (ref 0.7–4.0)
MCV: 96.2 fl (ref 78.0–100.0)
Monocytes Absolute: 0.6 10*3/uL (ref 0.1–1.0)
RBC: 4.16 Mil/uL — ABNORMAL LOW (ref 4.22–5.81)
WBC: 5.2 10*3/uL (ref 4.5–10.5)

## 2011-08-08 LAB — LIPID PANEL
Cholesterol: 178 mg/dL (ref 0–200)
Total CHOL/HDL Ratio: 3
Triglycerides: 132 mg/dL (ref 0.0–149.0)

## 2011-08-08 LAB — HEPATIC FUNCTION PANEL
ALT: 18 U/L (ref 0–53)
Bilirubin, Direct: 0.1 mg/dL (ref 0.0–0.3)
Total Protein: 6.7 g/dL (ref 6.0–8.3)

## 2011-08-15 ENCOUNTER — Ambulatory Visit (INDEPENDENT_AMBULATORY_CARE_PROVIDER_SITE_OTHER): Payer: Medicare Other | Admitting: Cardiology

## 2011-08-15 ENCOUNTER — Encounter: Payer: Self-pay | Admitting: Cardiology

## 2011-08-15 VITALS — BP 112/60 | HR 62 | Ht 70.0 in | Wt 200.0 lb

## 2011-08-15 DIAGNOSIS — F32A Depression, unspecified: Secondary | ICD-10-CM

## 2011-08-15 DIAGNOSIS — R0989 Other specified symptoms and signs involving the circulatory and respiratory systems: Secondary | ICD-10-CM

## 2011-08-15 DIAGNOSIS — F329 Major depressive disorder, single episode, unspecified: Secondary | ICD-10-CM

## 2011-08-15 DIAGNOSIS — C3490 Malignant neoplasm of unspecified part of unspecified bronchus or lung: Secondary | ICD-10-CM

## 2011-08-15 DIAGNOSIS — I2510 Atherosclerotic heart disease of native coronary artery without angina pectoris: Secondary | ICD-10-CM

## 2011-08-15 DIAGNOSIS — I10 Essential (primary) hypertension: Secondary | ICD-10-CM

## 2011-08-15 NOTE — Assessment & Plan Note (Signed)
Blood pressures control. No change in therapy. 

## 2011-08-15 NOTE — Patient Instructions (Addendum)
Your physician has requested that you have a carotid duplex. This test is an ultrasound of the carotid arteries in your neck. It looks at blood flow through these arteries that supply the brain with blood. Allow one hour for this exam. There are no restrictions or special instructions.  Your physician has requested that you have an echocardiogram. Echocardiography is a painless test that uses sound waves to create images of your heart. It provides your doctor with information about the size and shape of your heart and how well your heart's chambers and valves are working. This procedure takes approximately one hour. There are no restrictions for this procedure.  Your physician wants you to follow-up in: 1 year.  We will call you with the results of your echo and carotid doppler.   You will receive a reminder letter in the mail two months in advance. If you don't receive a letter, please call our office to schedule the follow-up appointment.

## 2011-08-15 NOTE — Assessment & Plan Note (Signed)
Fortunately he's had no recurrence of his lung cancer.

## 2011-08-15 NOTE — Assessment & Plan Note (Signed)
Fortunately the patient is doing well in this regard. Early after his initial diagnoses he had some problems.

## 2011-08-15 NOTE — Assessment & Plan Note (Signed)
Patient has question of a soft left carotid bruit. He has not had a carotid Doppler since his CABG 5 years ago. He needs a carotid Doppler and this will be scheduled.
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
## 2011-08-15 NOTE — Assessment & Plan Note (Signed)
Coronary disease is stable. He does not needed exercise test at this time. He does need a two-dimensional echo. It has been 5 years since we have assess his LV function. This will be arranged.

## 2011-08-27 ENCOUNTER — Other Ambulatory Visit: Payer: Self-pay

## 2011-08-27 ENCOUNTER — Ambulatory Visit (HOSPITAL_COMMUNITY): Payer: Medicare Other | Attending: Cardiology

## 2011-08-27 DIAGNOSIS — I2510 Atherosclerotic heart disease of native coronary artery without angina pectoris: Secondary | ICD-10-CM | POA: Insufficient documentation

## 2011-08-27 DIAGNOSIS — I10 Essential (primary) hypertension: Secondary | ICD-10-CM | POA: Insufficient documentation

## 2011-08-27 DIAGNOSIS — R0989 Other specified symptoms and signs involving the circulatory and respiratory systems: Secondary | ICD-10-CM | POA: Insufficient documentation

## 2011-09-05 ENCOUNTER — Telehealth: Payer: Self-pay | Admitting: Cardiology

## 2011-09-05 NOTE — Telephone Encounter (Signed)
Patient called wanting to know results of echo done 08/27/11.Patient was told echo revealed normal LV size and normal EF.Also wanting to know lab results was told labs look good.

## 2011-09-05 NOTE — Telephone Encounter (Signed)
Pt would like his results of his echo from 2wks ago

## 2011-09-09 ENCOUNTER — Encounter (INDEPENDENT_AMBULATORY_CARE_PROVIDER_SITE_OTHER): Payer: Medicare Other

## 2011-09-09 DIAGNOSIS — I6529 Occlusion and stenosis of unspecified carotid artery: Secondary | ICD-10-CM

## 2011-09-09 DIAGNOSIS — R0989 Other specified symptoms and signs involving the circulatory and respiratory systems: Secondary | ICD-10-CM

## 2011-09-17 ENCOUNTER — Encounter: Payer: Self-pay | Admitting: Cardiology

## 2011-09-17 DIAGNOSIS — I739 Peripheral vascular disease, unspecified: Secondary | ICD-10-CM

## 2011-10-01 ENCOUNTER — Other Ambulatory Visit: Payer: Self-pay | Admitting: *Deleted

## 2011-10-01 MED ORDER — AMLODIPINE BESYLATE 5 MG PO TABS: 5.0000 mg | ORAL_TABLET | Freq: Every day | ORAL | Status: DC

## 2012-07-05 ENCOUNTER — Telehealth: Payer: Self-pay | Admitting: Cardiology

## 2012-07-05 NOTE — Telephone Encounter (Signed)
Pt said he wants to be scheduled for lab work prior to office visit if this is not done prior to appt he is not coming back to get it done

## 2012-07-05 NOTE — Telephone Encounter (Signed)
Which labs does [name] need?  Last labs were done 4/13.

## 2012-07-06 NOTE — Telephone Encounter (Signed)
Pt became angry when I told him who I was and states he "told them yesterday that he does not want to come back".  He states he is not interested in having any lab work.  I apologized to him and let him go.

## 2012-07-16 ENCOUNTER — Other Ambulatory Visit: Payer: Self-pay | Admitting: *Deleted

## 2012-07-16 DIAGNOSIS — I2510 Atherosclerotic heart disease of native coronary artery without angina pectoris: Secondary | ICD-10-CM

## 2012-07-20 ENCOUNTER — Ambulatory Visit: Payer: Medicare Other | Admitting: Surgery

## 2012-07-21 ENCOUNTER — Encounter: Payer: Self-pay | Admitting: Surgery

## 2012-07-21 ENCOUNTER — Ambulatory Visit (INDEPENDENT_AMBULATORY_CARE_PROVIDER_SITE_OTHER): Payer: Medicare Other | Admitting: Surgery

## 2012-07-21 ENCOUNTER — Ambulatory Visit
Admission: RE | Admit: 2012-07-21 | Discharge: 2012-07-21 | Disposition: A | Discharge status: Home / Self Care | Payer: Medicare Other | Source: Ambulatory Visit | Attending: Surgery | Admitting: Surgery

## 2012-07-21 DIAGNOSIS — C3410 Malignant neoplasm of upper lobe, unspecified bronchus or lung: Secondary | ICD-10-CM

## 2012-07-21 DIAGNOSIS — I2510 Atherosclerotic heart disease of native coronary artery without angina pectoris: Secondary | ICD-10-CM

## 2012-07-21 DIAGNOSIS — Z09 Encounter for follow-up examination after completed treatment for conditions other than malignant neoplasm: Secondary | ICD-10-CM

## 2012-07-21 NOTE — Progress Notes (Signed)
   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 Since I last saw him he has had no new complaints. He has continued to abstain from smoking. His weight has been stable.    Current Outpatient Prescriptions  Medication Sig Dispense Refill  . amLODipine (NORVASC) 5 MG tablet Take 1 tablet (5 mg total) by mouth daily.  90 tablet  2  . aspirin 81 MG tablet Take 81 mg by mouth daily.        . diphenhydramine-acetaminophen (TYLENOL PM) 25-500 MG TABS Take 2 tablets by mouth at bedtime as needed.        . metoprolol tartrate (LOPRESSOR) 25 MG tablet Take 25 mg by mouth 2 (two) times daily.        . psyllium (METAMUCIL) 58.6 % powder Take 1 packet by mouth daily.        Marland Kitchen senna (SENOKOT) 8.6 MG tablet Take 1 tablet by mouth daily as needed.       . simvastatin (ZOCOR) 40 MG tablet Take 40 mg by mouth at bedtime.         No current facility-administered medications for this visit.   Review of Systems:  He denies any headaches or visual changes. He denies cough, sputum production, and hemoptysis. He denies any chest pain or pressure. He denies any abdominal pain, constipation. He denies any muscle or bone pain.   Physical Exam:  He looks well. There is no cervical or supraclavicular adenopathy. Lung exam is clear. The right chest incision scar looks fine. There are no skin lesions. Cardiac exam shows a regular rate and rhythm with normal heart sounds. Abdominal exam shows active bowel sounds. His abdomen is soft, mildly obese and nontender. There are no palpable masses or organomegaly   Diagnostic Tests:  *RADIOLOGY REPORT*   Clinical Data: Previous CABG.  Smoking history.   CHEST - 2 VIEW   Comparison: 07/22/2011   Findings: There  has been previous median sternotomy and CABG. Heart size is normal.  There is mild chronic scarring most notable the right lung base.  There are a few chronic calcified granulomas. No sign of active infiltrate, mass, effusion or collapse.  No acute bony finding.   IMPRESSION: Previous CABG.  Mild pulmonary scarring.  Scattered granulomas.  No active disease evident.     Original Report Authenticated By: Paulina Fusi, M.D.      Impression:  He continues to do well following right upper lobectomy for early stage lung cancer in June of 2008.  Plan:  I plan to see him back in one year for follow-up with a  chest x-ray.

## 2012-08-26 ENCOUNTER — Other Ambulatory Visit: Payer: Self-pay

## 2012-08-26 MED ORDER — AMLODIPINE BESYLATE 5 MG PO TABS: 5.0000 mg | ORAL_TABLET | Freq: Every day | ORAL | Status: AC

## 2012-09-10 ENCOUNTER — Ambulatory Visit: Payer: Medicare Other | Admitting: Cardiology

## 2013-07-19 ENCOUNTER — Other Ambulatory Visit: Payer: Self-pay | Admitting: *Deleted

## 2013-07-19 DIAGNOSIS — C3490 Malignant neoplasm of unspecified part of unspecified bronchus or lung: Secondary | ICD-10-CM

## 2013-07-20 ENCOUNTER — Other Ambulatory Visit: Payer: Self-pay | Admitting: *Deleted

## 2013-07-20 DIAGNOSIS — C3490 Malignant neoplasm of unspecified part of unspecified bronchus or lung: Secondary | ICD-10-CM

## 2013-07-26 ENCOUNTER — Ambulatory Visit: Payer: Medicare Other | Admitting: Surgery

## 2013-07-27 ENCOUNTER — Ambulatory Visit
Admission: RE | Admit: 2013-07-27 | Discharge: 2013-07-27 | Disposition: A | Discharge status: Home / Self Care | Payer: Commercial Managed Care - HMO | Source: Ambulatory Visit | Attending: Surgery | Admitting: Surgery

## 2013-07-27 ENCOUNTER — Ambulatory Visit (INDEPENDENT_AMBULATORY_CARE_PROVIDER_SITE_OTHER): Payer: Commercial Managed Care - HMO | Admitting: Surgery

## 2013-07-27 ENCOUNTER — Encounter: Payer: Self-pay | Admitting: Surgery

## 2013-07-27 VITALS — BP 136/73 | HR 57 | Resp 20 | Ht 70.0 in | Wt 190.0 lb

## 2013-07-27 DIAGNOSIS — Z9889 Other specified postprocedural states: Secondary | ICD-10-CM

## 2013-07-27 DIAGNOSIS — Z85118 Personal history of other malignant neoplasm of bronchus and lung: Secondary | ICD-10-CM

## 2013-07-27 DIAGNOSIS — C3490 Malignant neoplasm of unspecified part of unspecified bronchus or lung: Secondary | ICD-10-CM

## 2013-07-27 DIAGNOSIS — Z902 Acquired absence of lung [part of]: Secondary | ICD-10-CM

## 2013-07-27 NOTE — Progress Notes (Signed)
     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 Since I last saw him he has had no new complaints. He has continued to abstain from smoking. His weight has been stable.   Current Outpatient Prescriptions  Medication Sig Dispense Refill  . amLODipine (NORVASC) 5 MG tablet Take 1 tablet (5 mg total) by mouth daily.  90 tablet  0  . aspirin 81 MG tablet Take 81 mg by mouth daily.        . diphenhydramine-acetaminophen (TYLENOL PM) 25-500 MG TABS Take 2 tablets by mouth at bedtime as needed.        . metoprolol tartrate (LOPRESSOR) 25 MG tablet Take 25 mg by mouth 2 (two) times daily.        . psyllium (METAMUCIL) 58.6 % powder Take 1 packet by mouth daily.        Marland Kitchen senna (SENOKOT) 8.6 MG tablet Take 1 tablet by mouth daily as needed.       . simvastatin (ZOCOR) 40 MG tablet Take 40 mg by mouth at bedtime.         No current facility-administered medications for this visit.     Physical Exam: BP 136/73  Pulse 57  Resp 20  Ht 5\' 10"  (1.778 m)  Wt 190 lb (86.183 kg)  BMI 27.26 kg/m2  SpO2 97% He looks well.  There is no cervical or supraclavicular adenopathy.  Lung exam is clear.  The right chest incision scar looks fine. There are no skin lesions.  Cardiac exam shows a regular rate and rhythm with normal heart sounds.  Abdominal exam shows active bowel sounds. His abdomen is soft, mildly obese and nontender. There are no palpable masses or organomegaly    Diagnostic Tests:  CLINICAL DATA: History of lung carcinoma  EXAM:  CHEST 2 VIEW  COMPARISON: Chest radiograph July 21, 2012  FINDINGS:  There is scarring on the right lung base. There is a granuloma in  the left lower lobe which is calcified. There is a stable 6 mm  nodular opacity in the left mid lung. There is fullness in the right  perihilar region which is  stable. There is postoperative change in  the right hilum.  Heart size is normal. Pulmonary vascularity reflects a degree of  underlying emphysema. There is no new opacity. No bone lesions.  IMPRESSION:  Underlying emphysema with scarring. Apparent granulomas on the left.  No new opacity. No change from prior study.  Electronically Signed  By: Lowella Grip M.D.  On: 07/27/2013 09:55    Impression:   He continues to do well following right upper lobectomy for early stage lung cancer in June of 2008.   Plan:   I plan to see him back in one year for follow-up with a chest x-ray.

## 2014-08-01 ENCOUNTER — Other Ambulatory Visit: Payer: Self-pay | Admitting: Surgery

## 2014-08-01 DIAGNOSIS — I739 Peripheral vascular disease, unspecified: Principal | ICD-10-CM

## 2014-08-01 DIAGNOSIS — I779 Disorder of arteries and arterioles, unspecified: Secondary | ICD-10-CM

## 2014-08-02 ENCOUNTER — Ambulatory Visit: Payer: Commercial Managed Care - HMO | Admitting: Surgery

## 2014-08-08 ENCOUNTER — Other Ambulatory Visit: Payer: Self-pay | Admitting: Surgery

## 2014-08-08 DIAGNOSIS — Z951 Presence of aortocoronary bypass graft: Secondary | ICD-10-CM

## 2014-08-09 ENCOUNTER — Ambulatory Visit
Admission: RE | Admit: 2014-08-09 | Discharge: 2014-08-09 | Disposition: A | Discharge status: Home / Self Care | Payer: Commercial Managed Care - HMO | Source: Ambulatory Visit | Attending: Surgery | Admitting: Surgery

## 2014-08-09 ENCOUNTER — Encounter: Payer: Self-pay | Admitting: Surgery

## 2014-08-09 ENCOUNTER — Ambulatory Visit (INDEPENDENT_AMBULATORY_CARE_PROVIDER_SITE_OTHER): Payer: Commercial Managed Care - HMO | Admitting: Surgery

## 2014-08-09 VITALS — BP 158/86 | HR 64 | Resp 20 | Ht 70.0 in | Wt 190.0 lb

## 2014-08-09 DIAGNOSIS — Z85118 Personal history of other malignant neoplasm of bronchus and lung: Secondary | ICD-10-CM | POA: Diagnosis not present

## 2014-08-09 DIAGNOSIS — Z951 Presence of aortocoronary bypass graft: Secondary | ICD-10-CM | POA: Diagnosis not present

## 2014-08-09 DIAGNOSIS — I739 Peripheral vascular disease, unspecified: Principal | ICD-10-CM

## 2014-08-09 DIAGNOSIS — I779 Disorder of arteries and arterioles, unspecified: Secondary | ICD-10-CM

## 2014-08-09 DIAGNOSIS — Z9889 Other specified postprocedural states: Secondary | ICD-10-CM | POA: Diagnosis not present

## 2014-08-09 DIAGNOSIS — J929 Pleural plaque without asbestos: Secondary | ICD-10-CM | POA: Diagnosis not present

## 2014-08-09 DIAGNOSIS — Z902 Acquired absence of lung [part of]: Secondary | ICD-10-CM

## 2014-08-10 ENCOUNTER — Encounter: Payer: Self-pay | Admitting: Surgery

## 2014-08-10 NOTE — Progress Notes (Signed)
     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 Since I last saw him he has had no new complaints. He has continued to abstain from smoking. His weight has been stable. He is followed at the [facility] in [place].  Current Outpatient Prescriptions  Medication Sig Dispense Refill  . amLODipine (NORVASC) 5 MG tablet Take 1 tablet (5 mg total) by mouth daily. 90 tablet 0  . aspirin 81 MG tablet Take 81 mg by mouth daily.      . diphenhydramine-acetaminophen (TYLENOL PM) 25-500 MG TABS Take 2 tablets by mouth at bedtime as needed.      . metoprolol tartrate (LOPRESSOR) 25 MG tablet Take 25 mg by mouth 2 (two) times daily.      . psyllium (METAMUCIL) 58.6 % powder Take 1 packet by mouth daily.      Marland Kitchen senna (SENOKOT) 8.6 MG tablet Take 1 tablet by mouth daily as needed.     . simvastatin (ZOCOR) 40 MG tablet Take 40 mg by mouth at bedtime.       No current facility-administered medications for this visit.     Physical Exam: BP 158/86 mmHg  Pulse 64  Resp 20  Ht '5\' 10"'$  (1.778 m)  Wt 190 lb (86.183 kg)  BMI 27.26 kg/m2  SpO2 97% He looks well.  There is no cervical or supraclavicular adenopathy.  Lung exam is clear.  The right chest incision scar looks fine. There are no skin lesions.  Cardiac exam shows a regular rate and rhythm with normal heart sounds.  Abdominal exam shows active bowel sounds. His abdomen is soft, mildly obese and nontender. There are no palpable masses or organomegaly    Diagnostic Tests:  CLINICAL DATA: CABG.  EXAM: CHEST 2 VIEW  COMPARISON: 07/27/2013. 07/21/2012.  FINDINGS: Mediastinum and hilar structures are normal. Prior CABG. Heart size normal. Stable pleural parenchymal thickening consistent with scarring. Stable calcified nodular densities left lung  base consistent with granulomas. No pleural effusion pneumothorax. No acute bony abnormality.  IMPRESSION: 1. Stable bilateral pleural parenchymal thickening consistent with scarring.  2. Prior CABG. Heart size stable. No acute abnormality.   Electronically Signed  By: Marcello Moores Register  On: 08/09/2014 15:56   Impression:  He continues to do well following right upper lobectomy for early stage lung cancer in June of 2008.   Plan:   I plan to see him back in one year for follow-up with a chest x-ray.   Gaye Pollack, MD [facility] [phone]

## 2014-12-11 DIAGNOSIS — L255 Unspecified contact dermatitis due to plants, except food: Secondary | ICD-10-CM | POA: Diagnosis not present

## 2015-08-07 ENCOUNTER — Other Ambulatory Visit: Payer: Self-pay | Admitting: Surgery

## 2015-08-07 DIAGNOSIS — C3490 Malignant neoplasm of unspecified part of unspecified bronchus or lung: Secondary | ICD-10-CM

## 2015-08-08 ENCOUNTER — Ambulatory Visit
Admission: RE | Admit: 2015-08-08 | Discharge: 2015-08-08 | Disposition: A | Discharge status: Home / Self Care | Payer: Commercial Managed Care - HMO | Source: Ambulatory Visit | Attending: Surgery | Admitting: Surgery

## 2015-08-08 ENCOUNTER — Ambulatory Visit (INDEPENDENT_AMBULATORY_CARE_PROVIDER_SITE_OTHER): Payer: Commercial Managed Care - HMO | Admitting: Surgery

## 2015-08-08 ENCOUNTER — Encounter: Payer: Self-pay | Admitting: Surgery

## 2015-08-08 VITALS — BP 159/75 | HR 53 | Resp 16 | Ht 70.0 in | Wt 202.8 lb

## 2015-08-08 DIAGNOSIS — Z902 Acquired absence of lung [part of]: Secondary | ICD-10-CM | POA: Diagnosis not present

## 2015-08-08 DIAGNOSIS — Z85118 Personal history of other malignant neoplasm of bronchus and lung: Secondary | ICD-10-CM

## 2015-08-08 DIAGNOSIS — C3490 Malignant neoplasm of unspecified part of unspecified bronchus or lung: Secondary | ICD-10-CM

## 2015-08-08 NOTE — Progress Notes (Signed)
      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 Since I last saw him a year ago he has had no new complaints. He has continued to abstain from smoking. His weight has been stable. He is followed at the [facility] in [place].   Current Outpatient Prescriptions  Medication Sig Dispense Refill  . amLODipine (NORVASC) 5 MG tablet Take 1 tablet (5 mg total) by mouth daily. 90 tablet 0  . aspirin 81 MG tablet Take 81 mg by mouth daily.      . diphenhydramine-acetaminophen (TYLENOL PM) 25-500 MG TABS Take 2 tablets by mouth at bedtime as needed.      . metoprolol tartrate (LOPRESSOR) 25 MG tablet Take 25 mg by mouth 2 (two) times daily.      . psyllium (METAMUCIL) 58.6 % powder Take 1 packet by mouth daily.      Marland Kitchen senna (SENOKOT) 8.6 MG tablet Take 1 tablet by mouth daily as needed.     . simvastatin (ZOCOR) 40 MG tablet Take 40 mg by mouth at bedtime.       No current facility-administered medications for this visit.     Physical Exam:  BP 159/75 mmHg  Pulse 53  Resp 16  Ht '5\' 10"'$  (1.778 m)  Wt 202 lb 12.8 oz (91.989 kg)  BMI 29.10 kg/m2  SpO2 96% He looks well.  There is no cervical or supraclavicular adenopathy.  Lung exam is clear.  The right chest incision scar looks fine. There are no skin lesions.  Cardiac exam shows a regular rate and rhythm with normal heart sounds.  Abdominal exam shows active bowel sounds. His abdomen is soft, mildly obese and nontender. There are no palpable masses or organomegaly    Diagnostic Tests:  CLINICAL DATA: History of lung cancer. Prior right upper lobectomy.  EXAM: CHEST 2 VIEW  COMPARISON: 08/09/2014  FINDINGS: Prior median sternotomy. Postsurgical changes in the right lung. Scarring at the right base. Left lung is clear. No effusions or acute  airspace opacities. Heart is normal size. No acute bony abnormality.  IMPRESSION: Postoperative changes on the right. Scarring at the right base. No acute findings.   Electronically Signed  By: Rolm Baptise M.D.  On: 08/08/2015 10:02   Impression:  He continues to do well following right upper lobectomy for early stage lung cancer in June of 2008.   Plan:   I plan to see him back in one year for follow-up with a CT scan of the chest.   Gaye Pollack, MD [facility] [phone]

## 2016-06-20 ENCOUNTER — Other Ambulatory Visit: Payer: Self-pay | Admitting: Surgery

## 2016-06-20 DIAGNOSIS — C3490 Malignant neoplasm of unspecified part of unspecified bronchus or lung: Secondary | ICD-10-CM

## 2016-08-06 ENCOUNTER — Ambulatory Visit
Admission: RE | Admit: 2016-08-06 | Discharge: 2016-08-06 | Disposition: A | Discharge status: Home / Self Care | Payer: Medicare HMO | Source: Ambulatory Visit | Attending: Surgery | Admitting: Surgery

## 2016-08-06 ENCOUNTER — Encounter: Payer: Self-pay | Admitting: Surgery

## 2016-08-06 ENCOUNTER — Ambulatory Visit (INDEPENDENT_AMBULATORY_CARE_PROVIDER_SITE_OTHER): Payer: Medicare HMO | Admitting: Surgery

## 2016-08-06 VITALS — BP 133/76 | HR 56 | Resp 20 | Ht 70.0 in | Wt 200.0 lb

## 2016-08-06 DIAGNOSIS — C3490 Malignant neoplasm of unspecified part of unspecified bronchus or lung: Secondary | ICD-10-CM

## 2016-08-06 DIAGNOSIS — Z902 Acquired absence of lung [part of]: Secondary | ICD-10-CM

## 2016-08-06 DIAGNOSIS — Z85118 Personal history of other malignant neoplasm of bronchus and lung: Secondary | ICD-10-CM

## 2016-08-06 DIAGNOSIS — C3411 Malignant neoplasm of upper lobe, right bronchus or lung: Secondary | ICD-10-CM | POA: Diagnosis not present

## 2016-08-07 ENCOUNTER — Encounter: Payer: Self-pay | Admitting: Surgery

## 2016-08-07 NOTE — Progress Notes (Signed)
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 Since I last saw him a year ago he has had no new complaints although he feels like he is getting older and more tired. He has continued to abstain from smoking. His weight has been stable. He is followed at the [facility] in [place].  Current Outpatient Prescriptions  Medication Sig Dispense Refill  . amLODipine (NORVASC) 5 MG tablet Take 1 tablet (5 mg total) by mouth daily. 90 tablet 0  . aspirin 81 MG tablet Take 81 mg by mouth daily.      . diphenhydramine-acetaminophen (TYLENOL PM) 25-500 MG TABS Take 2 tablets by mouth at bedtime as needed.      . metoprolol tartrate (LOPRESSOR) 25 MG tablet Take 25 mg by mouth 2 (two) times daily.      . psyllium (METAMUCIL) 58.6 % powder Take 1 packet by mouth daily.      Marland Kitchen senna (SENOKOT) 8.6 MG tablet Take 1 tablet by mouth daily as needed.     . simvastatin (ZOCOR) 40 MG tablet Take 40 mg by mouth at bedtime.       No current facility-administered medications for this visit.      Physical Exam: BP 133/76   Pulse (!) 56   Resp 20   Ht '5\' 10"'$  (1.778 m)   Wt 200 lb (90.7 kg)   SpO2 97%   BMI 28.70 kg/m  He looks well.  There is no cervical or supraclavicular adenopathy.  Lung exam is clear.  The right chest incision scar looks fine. There are no skin lesions.  Cardiac exam shows a regular rate and rhythm with normal heart sounds.  Abdominal exam shows active bowel sounds. His abdomen is soft, mildly obese and nontender. There are no palpable masses or organomegaly   Diagnostic Tests:  CLINICAL DATA:  Follow up lung cancer status post right upper lobe resection eighteen years ago. No subsequent therapy. Shortness of breath.  EXAM: CT CHEST WITHOUT CONTRAST  TECHNIQUE: Multidetector CT imaging of the  chest was performed following the standard protocol without IV contrast.  COMPARISON:  Radiographs 08/08/2015.  Chest CT 04/25/2008.  FINDINGS: Cardiovascular: There is atherosclerosis of the aorta, great vessels and coronary arteries status post median sternotomy and CABG. The heart size is normal. There is no pericardial effusion.  Mediastinum/Nodes: There are no enlarged mediastinal, hilar or axillary lymph nodes.There are stable small calcified mediastinal lymph nodes. There is a moderate size hiatal hernia. The trachea and esophagus demonstrate no significant findings.  Lungs/Pleura: There is no pleural effusion. Status post right upper lobe resection. Right apical scarring, scattered emphysema and scattered calcified granulomas are again noted. There are no suspicious pulmonary nodules. There is patchy peribronchial opacity posteriorly in the right lower lobe, slightly different in configuration compared with the prior study, likely inflammatory.  Upper abdomen: The visualized upper abdomen appears stable with multiple splenules.  Musculoskeletal/Chest wall: There is no chest wall mass or suspicious osseous finding.  IMPRESSION: 1. Fluctuating patchy peribronchial opacity in the right lower lobe, likely chronic or recurrent inflammation. 2. Otherwise stable postoperative chest CT. No evidence of local recurrence or metastatic disease. 3. Stable emphysema and sequela of prior granulomatous disease. 4. Atherosclerosis post CABG.   Electronically Signed   By: Richardean Sale M.D.   On: 08/06/2016 10:27  Impression:  He continues to do well 10 years following right upper lobectomy for early stage lung cancer in June of 2008.   Plan:   I plan to see him back in one year for follow-up with a CXR.  Gaye Pollack, MD [facility] [phone]

## 2016-11-28 DIAGNOSIS — S80211A Abrasion, right knee, initial encounter: Secondary | ICD-10-CM | POA: Diagnosis not present

## 2017-07-29 ENCOUNTER — Encounter: Payer: Medicare HMO | Admitting: Surgery

## 2017-08-05 ENCOUNTER — Encounter: Payer: Medicare HMO | Admitting: Surgery

## 2017-08-19 ENCOUNTER — Ambulatory Visit: Payer: Medicare HMO | Admitting: Surgery

## 2017-08-19 ENCOUNTER — Other Ambulatory Visit: Payer: Self-pay | Admitting: Surgery

## 2017-08-19 ENCOUNTER — Encounter: Payer: Self-pay | Admitting: Surgery

## 2017-08-19 ENCOUNTER — Ambulatory Visit
Admission: RE | Admit: 2017-08-19 | Discharge: 2017-08-19 | Disposition: A | Discharge status: Home / Self Care | Payer: Medicare HMO | Source: Ambulatory Visit | Attending: Surgery | Admitting: Surgery

## 2017-08-19 VITALS — BP 150/68 | HR 49 | Resp 20 | Ht 70.0 in | Wt 197.0 lb

## 2017-08-19 DIAGNOSIS — J984 Other disorders of lung: Secondary | ICD-10-CM | POA: Diagnosis not present

## 2017-08-19 DIAGNOSIS — Z85118 Personal history of other malignant neoplasm of bronchus and lung: Secondary | ICD-10-CM | POA: Diagnosis not present

## 2017-08-19 DIAGNOSIS — Z902 Acquired absence of lung [part of]: Secondary | ICD-10-CM | POA: Diagnosis not present

## 2017-08-19 DIAGNOSIS — C3490 Malignant neoplasm of unspecified part of unspecified bronchus or lung: Secondary | ICD-10-CM | POA: Diagnosis not present

## 2017-08-19 NOTE — Progress Notes (Signed)
     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 Since I last saw him a year ago he has had no new complaints. He has continued to abstain from smoking. His weight has been stable.  He denies headaches.  He has macular degeneration and has injections in both eyes monthly.  He is followed at the [facility] in [place].    Current Outpatient Medications  Medication Sig Dispense Refill  . amLODipine (NORVASC) 5 MG tablet Take 1 tablet (5 mg total) by mouth daily. 90 tablet 0  . aspirin 81 MG tablet Take 81 mg by mouth daily.      . diphenhydramine-acetaminophen (TYLENOL PM) 25-500 MG TABS Take 2 tablets by mouth at bedtime as needed.      . metoprolol tartrate (LOPRESSOR) 25 MG tablet Take 25 mg by mouth 2 (two) times daily.      . psyllium (METAMUCIL) 58.6 % powder Take 1 packet by mouth daily.      Marland Kitchen senna (SENOKOT) 8.6 MG tablet Take 1 tablet by mouth daily as needed.     . simvastatin (ZOCOR) 40 MG tablet Take 40 mg by mouth at bedtime.       No current facility-administered medications for this visit.      Physical Exam: BP (!) 150/68   Pulse (!) 49   Resp 20   Ht 5\' 10"  (1.778 m)   Wt 197 lb (89.4 kg)   SpO2 98% Comment: RA  BMI 28.27 kg/m  He looks well. There is no cervical or supraclavicular adenopathy. Cardiac exam shows a regular rate and rhythm with normal heart sounds. Lungs are clear. The right chest incisions are well-healed with no skin lesions or palpable abnormality.  Diagnostic Tests:  CLINICAL DATA:  History of lung cancer.  No complaints  EXAM: CHEST - 2 VIEW  COMPARISON:  Chest CT 08/06/2016  FINDINGS: Postoperative distortion and volume loss on the right that is stable when compared to scanogram. Presumed fiducial over the right mid lung. There is a nodular density at the  left base that is calcified by CT. Normal heart size and aortic contours. Status post CABG. No acute osseous finding.  IMPRESSION: Stable postoperative chest.   Electronically Signed   By: [name] M.D.   On: 08/19/2017 12:07   Impression:  He continues to do well 11 years following right upper lobectomy for early stage lung cancer in June of 2008.   Plan:   I plan to see him back in one year for follow-up with a  low-dose lung cancer screening CT of the chest.  I spent 15 minutes performing this established patient evaluation and > 50% of this time was spent face to face counseling and coordinating the care of this patient's lung cancer surveillance.    Colton Pollack, MD [facility] [phone]

## 2018-07-12 ENCOUNTER — Other Ambulatory Visit: Payer: Self-pay | Admitting: Surgery

## 2018-07-12 DIAGNOSIS — C3490 Malignant neoplasm of unspecified part of unspecified bronchus or lung: Secondary | ICD-10-CM

## 2018-07-19 ENCOUNTER — Other Ambulatory Visit: Payer: Self-pay | Admitting: Surgery

## 2018-07-19 DIAGNOSIS — C3490 Malignant neoplasm of unspecified part of unspecified bronchus or lung: Secondary | ICD-10-CM

## 2018-09-07 ENCOUNTER — Ambulatory Visit
Admission: RE | Admit: 2018-09-07 | Discharge: 2018-09-07 | Disposition: A | Discharge status: Home / Self Care | Payer: Medicare HMO | Source: Ambulatory Visit | Attending: Surgery | Admitting: Surgery

## 2018-09-07 ENCOUNTER — Other Ambulatory Visit: Payer: Self-pay

## 2018-09-07 DIAGNOSIS — C3490 Malignant neoplasm of unspecified part of unspecified bronchus or lung: Secondary | ICD-10-CM

## 2018-09-07 DIAGNOSIS — J439 Emphysema, unspecified: Secondary | ICD-10-CM | POA: Diagnosis not present

## 2018-09-08 ENCOUNTER — Encounter: Payer: Medicare HMO | Admitting: Surgery

## 2018-09-08 ENCOUNTER — Other Ambulatory Visit: Payer: Self-pay

## 2018-09-09 ENCOUNTER — Encounter: Payer: Self-pay | Admitting: Surgery

## 2018-09-09 ENCOUNTER — Ambulatory Visit: Payer: Medicare HMO | Admitting: Surgery

## 2018-09-09 VITALS — BP 179/72 | HR 51 | Temp 97.9°F | Resp 15 | Ht 70.0 in | Wt 197.0 lb

## 2018-09-09 DIAGNOSIS — Z902 Acquired absence of lung [part of]: Secondary | ICD-10-CM | POA: Diagnosis not present

## 2018-09-09 DIAGNOSIS — Z85118 Personal history of other malignant neoplasm of bronchus and lung: Secondary | ICD-10-CM | POA: Diagnosis not present

## 2018-09-09 NOTE — Progress Notes (Signed)
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 Since I last saw him a year ago he has had no new complaints.He has continued to abstain from smoking. His weight has been stable.  He denies headaches. He is followed at the [facility] in [place].  Current Outpatient Medications  Medication Sig Dispense Refill  . amLODipine (NORVASC) 5 MG tablet Take 1 tablet (5 mg total) by mouth daily. 90 tablet 0  . aspirin 81 MG tablet Take 81 mg by mouth daily.      . diphenhydramine-acetaminophen (TYLENOL PM) 25-500 MG TABS Take 2 tablets by mouth at bedtime as needed.      . metoprolol tartrate (LOPRESSOR) 25 MG tablet Take 25 mg by mouth 2 (two) times daily.      . psyllium (METAMUCIL) 58.6 % powder Take 1 packet by mouth daily.      Marland Kitchen senna (SENOKOT) 8.6 MG tablet Take 1 tablet by mouth daily as needed.     . sildenafil (VIAGRA) 100 MG tablet Take 100 mg by mouth daily as needed for erectile dysfunction.    . simvastatin (ZOCOR) 40 MG tablet Take 40 mg by mouth at bedtime.       No current facility-administered medications for this visit.      Physical Exam:  BP (!) 179/72 (BP Location: Right Arm, Patient Position: Sitting, Cuff Size: Large)   Pulse (!) 51   Temp 97.9 F (36.6 C) (Skin)   Resp 15   Ht 5\' 10"  (1.778 m)   Wt 197 lb (89.4 kg)   SpO2 95% Comment: RA  BMI 28.27 kg/m  Looks well. Cardiac exam shows a regular rate and rhythm with normal heart sounds. Lungs are clear. There is no cervical or supraclavicular adenopathy.  Diagnostic Tests:  CLINICAL DATA:  Followup non-small cell lung cancer status post resection 11 years ago.  EXAM: CT CHEST WITHOUT CONTRAST  TECHNIQUE: Multidetector CT imaging of the chest was performed following the standard protocol without IV contrast.  COMPARISON:   08/06/16  FINDINGS: Cardiovascular: Normal heart size. No pericardial effusion. Previous median sternotomy and CABG procedure. Aortic atherosclerosis.  Mediastinum/Nodes: No supraclavicular or axillary adenopathy identified. No mediastinal adenopathy. Calcified mediastinal and hilar lymph nodes are identified. Normal appearance of the thyroid gland. The trachea appears patent and is midline. Moderate hiatal hernia.  Lungs/Pleura: Moderate to advanced changes of emphysema. Status post right upper lobe lung resection. Calcified granuloma identified in the right lower lobe multiple chronic reticular and nodular densities within the periphery of the right lower lobe are unchanged compatible with chronic inflammatory changes. Noncalcified nodule within the left lower lobe measures 5 mm, image 110/4. Unchanged. Anterolateral left lower lobe calcified granuloma.  Upper Abdomen: Normal appearance of the adrenal glands. Aortic atherosclerosis. No acute abnormality identified.  Musculoskeletal: No chest wall mass or suspicious bone lesions identified.  IMPRESSION: 1. Stable exam. No findings identified to suggest local recurrence or metastatic disease. 2. Chronic granulomatous disease. 3. Aortic Atherosclerosis (ICD10-I70.0) and Emphysema (ICD10-J43.9). 4. Hiatal hernia   Electronically Signed   By: Kerby Moors M.D.   On: 09/07/2018 13:30  Impression:  The patient is now 12 years out from lung cancer resection without evidence of recurrent or metastatic disease.  He continues to abstain from smoking.  His blood pressure is elevated today but he  said that he was worried about the results of his CT scan.  He said that he usually checks his pressure at home and is in the 150s.  I told him that a good goal blood pressure is 130/80.  Good blood pressure medications are managed at the [facility] in [place].  I reviewed the CT scan results with him and answered all of his  questions.  I recommended that he have a chest x-ray in 1 year.  Plan:  I will plan to see him back in 1 year with a chest x-ray.  I spent 15 minutes performing this established patient evaluation and > 50% of this time was spent face to face counseling and coordinating the care of this patient's previously resected lung cancer.    Gaye Pollack, MD [facility] [phone]

## 2018-12-24 DIAGNOSIS — M722 Plantar fascial fibromatosis: Secondary | ICD-10-CM | POA: Diagnosis not present

## 2019-04-04 DIAGNOSIS — Z20828 Contact with and (suspected) exposure to other viral communicable diseases: Secondary | ICD-10-CM | POA: Diagnosis not present

## 2019-04-04 DIAGNOSIS — U071 COVID-19: Secondary | ICD-10-CM | POA: Diagnosis not present

## 2019-04-04 DIAGNOSIS — Z1159 Encounter for screening for other viral diseases: Secondary | ICD-10-CM | POA: Diagnosis not present

## 2019-09-14 ENCOUNTER — Ambulatory Visit: Payer: Medicare HMO | Admitting: Surgery

## 2020-01-23 ENCOUNTER — Other Ambulatory Visit: Payer: Self-pay | Admitting: Surgery

## 2020-01-23 DIAGNOSIS — Z951 Presence of aortocoronary bypass graft: Secondary | ICD-10-CM

## 2020-01-25 ENCOUNTER — Ambulatory Visit: Payer: Medicare HMO | Admitting: Surgery

## 2020-01-26 ENCOUNTER — Encounter: Payer: Self-pay | Admitting: Surgery

## 2020-01-26 ENCOUNTER — Ambulatory Visit
Admission: RE | Admit: 2020-01-26 | Discharge: 2020-01-26 | Disposition: A | Discharge status: Home / Self Care | Payer: Medicare HMO | Source: Ambulatory Visit | Attending: Surgery | Admitting: Surgery

## 2020-01-26 ENCOUNTER — Other Ambulatory Visit: Payer: Self-pay

## 2020-01-26 ENCOUNTER — Ambulatory Visit: Payer: Medicare HMO | Admitting: Surgery

## 2020-01-26 VITALS — BP 180/69 | HR 55 | Temp 97.9°F | Resp 20 | Ht 70.0 in | Wt 200.0 lb

## 2020-01-26 DIAGNOSIS — Z85118 Personal history of other malignant neoplasm of bronchus and lung: Secondary | ICD-10-CM

## 2020-01-26 DIAGNOSIS — Z902 Acquired absence of lung [part of]: Secondary | ICD-10-CM | POA: Diagnosis not present

## 2020-01-26 DIAGNOSIS — Z951 Presence of aortocoronary bypass graft: Secondary | ICD-10-CM

## 2020-01-26 DIAGNOSIS — R918 Other nonspecific abnormal finding of lung field: Secondary | ICD-10-CM | POA: Diagnosis not present

## 2020-01-26 NOTE — Progress Notes (Signed)
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 Since I last saw him a year ago he has had no new complaints. He does some walking and plays golf for exercise.  He has continued to abstain from smoking. His weight has been stable.He denies headaches. [place] [facility] [place].  Current Outpatient Medications  Medication Sig Dispense Refill   amLODipine (NORVASC) 5 MG tablet Take 1 tablet (5 mg total) by mouth daily. 90 tablet 0   aspirin 81 MG tablet Take 81 mg by mouth daily.       diphenhydramine-acetaminophen (TYLENOL PM) 25-500 MG TABS Take 2 tablets by mouth at bedtime as needed.       metoprolol tartrate (LOPRESSOR) 25 MG tablet Take 25 mg by mouth 2 (two) times daily.       psyllium (METAMUCIL) 58.6 % powder Take 1 packet by mouth daily.       senna (SENOKOT) 8.6 MG tablet Take 1 tablet by mouth daily as needed.      sildenafil (VIAGRA) 100 MG tablet Take 100 mg by mouth daily as needed for erectile dysfunction.     simvastatin (ZOCOR) 40 MG tablet Take 40 mg by mouth at bedtime.       No current facility-administered medications for this visit.     Physical Exam: BP (!) 180/69    Pulse (!) 55    Temp 97.9 F (36.6 C) (Skin)    Resp 20    Ht 5\' 10"  (1.778 m)    Wt 200 lb (90.7 kg)    SpO2 95% Comment: RA   BMI 28.70 kg/m  He looks well. There is no cervical or supraclavicular adenopathy. Cardiac exam shows regular rate and rhythm with normal heart sounds.  There is no murmur. Lungs are clear.  Diagnostic Tests:  CLINICAL DATA:  History of CABG.  EXAM: CHEST - 2 VIEW  COMPARISON:  CT chest Sep 07, 2018 and multiple prior chest radiographs, most recent Aug 19, 2017.  FINDINGS: Prior median sternotomy for CABG. Postoperative changes involving the right lung. Scarring and nodular  opacities at the right lung base. The left lung is clear. No visible pleural effusions or pneumothorax. No acute osseous abnormality.  IMPRESSION: Postoperative changes involving the right lung with right basilar scarring and nodular opacities, likely representing the calcified granulomas seen on prior CT. Superimposed infection/aspiration in this region is difficult to exclude if the patient is symptomatic.   Electronically Signed   By: Margaretha Sheffield MD   On: 01/26/2020 09:59   Impression:  He continues to do well 13 years following lung cancer resection.  Chest x-ray shows some chronic scarring and nodular opacities at the right base due to his prior surgery but there is no sign of a new lung nodules.  Plan:  I will see him back in 1 year with a low-dose lung cancer screening CT scan of the chest.  I spent 15 minutes performing this established patient evaluation and > 50% of this time was spent face to face counseling and coordinating the surveillance of his previously resected lung cancer.   Gaye Pollack, MD [facility] [phone]

## 2020-12-24 ENCOUNTER — Other Ambulatory Visit: Payer: Self-pay | Admitting: Surgery

## 2020-12-24 DIAGNOSIS — C3411 Malignant neoplasm of upper lobe, right bronchus or lung: Secondary | ICD-10-CM

## 2021-02-06 ENCOUNTER — Other Ambulatory Visit: Payer: Medicare HMO

## 2021-02-06 ENCOUNTER — Ambulatory Visit: Payer: Medicare HMO | Admitting: Surgery

## 2021-03-24 ENCOUNTER — Emergency Department (HOSPITAL_COMMUNITY)
Admission: EM | Admit: 2021-03-24 | Discharge: 2021-03-24 | Disposition: A | Discharge status: Home / Self Care | Payer: No Typology Code available for payment source | Attending: Emergency Medicine | Admitting: Emergency Medicine

## 2021-03-24 ENCOUNTER — Encounter (HOSPITAL_COMMUNITY): Payer: Self-pay | Admitting: Emergency Medicine

## 2021-03-24 ENCOUNTER — Other Ambulatory Visit: Payer: Self-pay

## 2021-03-24 DIAGNOSIS — Z79899 Other long term (current) drug therapy: Secondary | ICD-10-CM | POA: Insufficient documentation

## 2021-03-24 DIAGNOSIS — I10 Essential (primary) hypertension: Secondary | ICD-10-CM | POA: Diagnosis not present

## 2021-03-24 DIAGNOSIS — Z7982 Long term (current) use of aspirin: Secondary | ICD-10-CM | POA: Diagnosis not present

## 2021-03-24 DIAGNOSIS — Z87891 Personal history of nicotine dependence: Secondary | ICD-10-CM | POA: Insufficient documentation

## 2021-03-24 DIAGNOSIS — J449 Chronic obstructive pulmonary disease, unspecified: Secondary | ICD-10-CM | POA: Insufficient documentation

## 2021-03-24 DIAGNOSIS — Z955 Presence of coronary angioplasty implant and graft: Secondary | ICD-10-CM | POA: Diagnosis not present

## 2021-03-24 DIAGNOSIS — U071 COVID-19: Secondary | ICD-10-CM | POA: Diagnosis not present

## 2021-03-24 DIAGNOSIS — I2510 Atherosclerotic heart disease of native coronary artery without angina pectoris: Secondary | ICD-10-CM | POA: Diagnosis not present

## 2021-03-24 DIAGNOSIS — Z951 Presence of aortocoronary bypass graft: Secondary | ICD-10-CM | POA: Insufficient documentation

## 2021-03-24 DIAGNOSIS — Z85118 Personal history of other malignant neoplasm of bronchus and lung: Secondary | ICD-10-CM | POA: Insufficient documentation

## 2021-03-24 DIAGNOSIS — R059 Cough, unspecified: Secondary | ICD-10-CM | POA: Diagnosis present

## 2021-03-24 LAB — RESP PANEL BY RT-PCR (FLU A&B, COVID) ARPGX2
Influenza A by PCR: NEGATIVE
Influenza B by PCR: NEGATIVE
SARS Coronavirus 2 by RT PCR: POSITIVE — AB

## 2021-03-24 MED ORDER — NIRMATRELVIR/RITONAVIR (PAXLOVID)TABLET: 3.0000 | ORAL_TABLET | Freq: Two times a day (BID) | ORAL | 0 refills | Status: AC

## 2021-03-24 NOTE — ED Notes (Signed)
Introduced self to pt. Cardiac monitor and pulse ox in place, BP cycling q 30 minutes.

## 2021-03-24 NOTE — ED Triage Notes (Signed)
Pt to the ED with complaints of being Covid positive using an at home test this morning.  Pt started feeling bad on Friday.  Pt has generalized body aches and pains with a cough and loss of taste and smell.

## 2021-03-24 NOTE — ED Notes (Signed)
Attempted to redock Istat for chem 8 results to cross over into chart. I-stat creatinine 0.8. EDP aware.
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
## 2021-03-24 NOTE — Discharge Instructions (Addendum)
Stop taking your Viagra and Zocor before starting Paxlovid.  You can resume these medications 5 days after finishing your last dose of Paxlovid.  Take your amlodipine only if your blood pressures greater than 330 systolic.  If you had any testing done today, the results will show up on your MyChart phone app in 24 hours.  Call your primary care doctor in the next 1-2 days to arrange video follow-up.   Use a finger pulse oximeter at home.  You may purchase one at [facility] or [facility] or online.  If the numbers drops and stays below 90%, return immediately back to the ER.  Otherwise increase your fluid intake, isolate at home for 5 days if symptoms have resolved, and inform recent close contacts of the need to test for Covid if they develop symptoms.

## 2021-03-24 NOTE — ED Notes (Signed)
I-stat crea .8

## 2021-03-25 LAB — I-STAT CREATININE, ED: Creatinine, Ser: 0.8 mg/dL (ref 0.61–1.24)

## 2021-09-01 ENCOUNTER — Ambulatory Visit
Admission: EM | Admit: 2021-09-01 | Discharge: 2021-09-01 | Disposition: A | Discharge status: Home / Self Care | Payer: Medicare HMO | Attending: Family Medicine | Admitting: Family Medicine

## 2021-09-01 DIAGNOSIS — M7989 Other specified soft tissue disorders: Secondary | ICD-10-CM | POA: Diagnosis not present

## 2021-09-01 DIAGNOSIS — R21 Rash and other nonspecific skin eruption: Secondary | ICD-10-CM | POA: Diagnosis not present

## 2021-09-01 MED ORDER — CLOBETASOL PROPIONATE 0.05 % EX OINT: 1.0000 "application " | TOPICAL_OINTMENT | Freq: Two times a day (BID) | CUTANEOUS | 0 refills | Status: AC
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
## 2021-09-01 NOTE — Discharge Instructions (Signed)
Compression stockings daily, elevate feet at rest.  Follow-up with PCP if worsening or not resolving

## 2021-09-01 NOTE — ED Triage Notes (Signed)
Pt states about a week ago he started having a rash on both ankles and both feet  Pt states his right leg has been swelling for the past weeks  Pt states he has not done anything unusual or changed any soaps, or detergents

## 2023-10-12 ENCOUNTER — Ambulatory Visit
Admission: EM | Admit: 2023-10-12 | Discharge: 2023-10-12 | Disposition: A | Discharge status: Home / Self Care | Attending: Nurse Practitioner | Admitting: Nurse Practitioner

## 2023-10-12 DIAGNOSIS — R21 Rash and other nonspecific skin eruption: Secondary | ICD-10-CM | POA: Diagnosis not present

## 2023-10-12 DIAGNOSIS — W57XXXA Bitten or stung by nonvenomous insect and other nonvenomous arthropods, initial encounter: Secondary | ICD-10-CM

## 2023-10-12 HISTORY — DX: Unspecified atrial fibrillation: I48.91

## 2023-10-12 MED ORDER — TRIAMCINOLONE ACETONIDE 0.1 % EX CREA: 1.0000 | TOPICAL_CREAM | Freq: Two times a day (BID) | CUTANEOUS | 0 refills | Status: AC

## 2023-10-12 MED ORDER — DOXYCYCLINE HYCLATE 100 MG PO TABS: 200.0000 mg | ORAL_TABLET | Freq: Once | ORAL | 0 refills | Status: AC

## 2023-10-12 NOTE — ED Triage Notes (Signed)
 Pt reports he has a boil on his right shoulder x 4 days
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
## 2023-10-12 NOTE — Discharge Instructions (Addendum)
 A large tick was removed from your back today.  Test for Lyme or [facility] spotted fever are pending.  You will be contacted if the pending test results are abnormal.  You will also have access to your results via MyChart. Take medication as prescribed. You may take over-the-counter Tylenol as needed for pain, fever, or general discomfort. Continue to monitor the area from where the tick was removed.  Monitor for a bull's-eye appearing rash, fever, chills, joint pain, joint swelling, severe fatigue, headache, nausea or vomiting.  If you develop any of the symptoms that cannot be explained, please follow-up with your primary care physician for further evaluation. Follow-up as needed.

## 2023-10-16 LAB — SPOTTED FEVER GROUP ANTIBODIES
Spotted Fever Group IgG: <1:64 {titer}
Spotted Fever Group IgM: <1:64 {titer}

## 2023-10-16 LAB — LYME DISEASE SEROLOGY W/REFLEX: Lyme Total Antibody EIA: NEGATIVE
